# Patient Record
Sex: Female | Born: 1970 | Race: Black or African American | Hispanic: No | Marital: Married | Smoking: Never smoker
Health system: Southern US, Community
[De-identification: ages and names within clinical notes are randomized; demographics above are authoritative.]

## PROBLEM LIST (undated history)

## (undated) DIAGNOSIS — F329 Major depressive disorder, single episode, unspecified: Secondary | ICD-10-CM

## (undated) DIAGNOSIS — D649 Anemia, unspecified: Secondary | ICD-10-CM

## (undated) DIAGNOSIS — F32A Depression, unspecified: Secondary | ICD-10-CM

## (undated) DIAGNOSIS — E785 Hyperlipidemia, unspecified: Secondary | ICD-10-CM

## (undated) DIAGNOSIS — D72829 Elevated white blood cell count, unspecified: Secondary | ICD-10-CM

## (undated) DIAGNOSIS — I10 Essential (primary) hypertension: Secondary | ICD-10-CM

## (undated) DIAGNOSIS — N939 Abnormal uterine and vaginal bleeding, unspecified: Secondary | ICD-10-CM

## (undated) DIAGNOSIS — F419 Anxiety disorder, unspecified: Secondary | ICD-10-CM

## (undated) DIAGNOSIS — I809 Phlebitis and thrombophlebitis of unspecified site: Secondary | ICD-10-CM

## (undated) DIAGNOSIS — D219 Benign neoplasm of connective and other soft tissue, unspecified: Secondary | ICD-10-CM

## (undated) HISTORY — DX: Benign neoplasm of connective and other soft tissue, unspecified: D21.9

## (undated) HISTORY — PX: HERNIA REPAIR: SHX51

## (undated) HISTORY — PX: OOPHORECTOMY: SHX86

## (undated) HISTORY — DX: Abnormal uterine and vaginal bleeding, unspecified: N93.9

## (undated) HISTORY — DX: Anxiety disorder, unspecified: F41.9

## (undated) HISTORY — DX: Phlebitis and thrombophlebitis of unspecified site: I80.9

## (undated) HISTORY — PX: WISDOM TOOTH EXTRACTION: SHX21

## (undated) HISTORY — DX: Anemia, unspecified: D64.9

## (undated) HISTORY — DX: Essential (primary) hypertension: I10

---
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
## 2003-05-01 DIAGNOSIS — I809 Phlebitis and thrombophlebitis of unspecified site: Secondary | ICD-10-CM

## 2003-05-01 HISTORY — DX: Phlebitis and thrombophlebitis of unspecified site: I80.9

## 2016-04-05 ENCOUNTER — Encounter (HOSPITAL_COMMUNITY): Payer: Self-pay

## 2016-04-05 ENCOUNTER — Emergency Department (HOSPITAL_COMMUNITY): Payer: BLUE CROSS/BLUE SHIELD

## 2016-04-05 ENCOUNTER — Emergency Department (HOSPITAL_COMMUNITY)
Admission: EM | Admit: 2016-04-05 | Discharge: 2016-04-05 | Disposition: A | Discharge status: Home / Self Care | Payer: BLUE CROSS/BLUE SHIELD | Attending: Emergency Medicine | Admitting: Emergency Medicine

## 2016-04-05 DIAGNOSIS — R9431 Abnormal electrocardiogram [ECG] [EKG]: Secondary | ICD-10-CM | POA: Diagnosis present

## 2016-04-05 DIAGNOSIS — R002 Palpitations: Secondary | ICD-10-CM | POA: Insufficient documentation

## 2016-04-05 LAB — I-STAT TROPONIN, ED
TROPONIN I, POC: 0 ng/mL (ref 0.00–0.08)
TROPONIN I, POC: 0 ng/mL (ref 0.00–0.08)

## 2016-04-05 LAB — BASIC METABOLIC PANEL
ANION GAP: 7 (ref 5–15)
BUN: 6 mg/dL (ref 6–20)
CALCIUM: 9.3 mg/dL (ref 8.9–10.3)
CO2: 24 mmol/L (ref 22–32)
Chloride: 104 mmol/L (ref 101–111)
Creatinine, Ser: 0.74 mg/dL (ref 0.44–1.00)
GLUCOSE: 125 mg/dL — AB (ref 65–99)
POTASSIUM: 3.8 mmol/L (ref 3.5–5.1)
SODIUM: 135 mmol/L (ref 135–145)

## 2016-04-05 LAB — CBC
HEMATOCRIT: 33.7 % — AB (ref 36.0–46.0)
HEMOGLOBIN: 11.5 g/dL — AB (ref 12.0–15.0)
MCH: 30.4 pg (ref 26.0–34.0)
MCHC: 34.1 g/dL (ref 30.0–36.0)
MCV: 89.2 fL (ref 78.0–100.0)
Platelets: 352 10*3/uL (ref 150–400)
RBC: 3.78 MIL/uL — AB (ref 3.87–5.11)
RDW: 13.6 % (ref 11.5–15.5)
WBC: 11.1 10*3/uL — AB (ref 4.0–10.5)

## 2016-04-05 NOTE — ED Notes (Signed)
Pt ambulatory at discharge. NAD. VSS.

## 2016-04-05 NOTE — ED Provider Notes (Signed)
[facility] DEPT Provider Note   CSN: [id number] Arrival date & time: 04/05/16  1344     History   Chief Complaint Chief Complaint  Patient presents with  . Abnormal ECG    HPI Melanie Hopkins is a 45 y.o. female.  HPI   Patient presents with palpitations.  Patient states this morning about 6:15 AM she was sitting in bed when she felt epigastric discomfort she describes as a "bubble" followed by heart racing that lasted about 3-5 minutes.  It resolved without any intervention.  She went to sleep and when she woke up again, had a BM around 9 AM and it happened again.  She saw her PMD where they performed and EKG.  She states it was "abnormal and my heart was missing beats".  She has a cardiology follow up appointment scheduled.  She presents to the ED for a second opinion.  She denies any fever, chills, cough, SOB, chest pain, N/V/D, or abdominal pain.  No syncope, diaphoresis, or presyncope.  Reports normal BMs, last today, no melena or hematochezia.  She denies cardiac history, family history of early CAD, tobacco use, or DM.  No hx of PE/DVT. She states she did start taking "Adrenal Support" vitamin a couple of weeks ago and wonders if that could be what's making her feel more nervous.  She states this did not feel like previous panic attacks.  She currently denies any symptoms, and states she has not experienced any symptoms since being in the ED.  History reviewed. No pertinent past medical history.  There are no active problems to display for this patient.   History reviewed. No pertinent surgical history.  OB History    No data available       Home Medications    Prior to Admission medications   Medication Sig Start Date End Date Taking? Authorizing Provider  ferrous sulfate (SLOW FE) 160 (50 Fe) MG TBCR SR tablet Take 160 mg by mouth daily.   Yes Historical Provider, MD  Multiple Vitamin (MULTIVITAMIN WITH MINERALS) TABS tablet Take 1 tablet by mouth daily.   Yes  Historical Provider, MD  NON FORMULARY Take 2 tablets by mouth 3 (three) times daily. Liver Cleanse Formula from [employer]   Yes Historical Provider, MD  NON FORMULARY Take 1 capsule by mouth 2 (two) times daily. Adrenal Support made by [employer]   Yes Historical Provider, MD  simethicone (MYLICON) 80 MG chewable tablet Chew 80 mg by mouth every 6 (six) hours as needed for flatulence.   Yes Historical Provider, MD    Family History History reviewed. No pertinent family history.  Social History Social History  Substance Use Topics  . Smoking status: Never Smoker  . Smokeless tobacco: Never Used  . Alcohol use No     Allergies   Patient has no known allergies.   Review of Systems Review of Systems All other systems negative unless otherwise stated in HPI   Physical Exam Updated Vital Signs BP 129/83   Pulse 111   Temp 98.1 F (36.7 C) (Oral)   Resp 22   Ht 5' (1.524 m)   Wt 93.4 kg   SpO2 100%   BMI 40.23 kg/m   Physical Exam  Constitutional: She is oriented to person, place, and time. She appears well-developed and well-nourished.  Non-toxic appearance. She does not have a sickly appearance. She does not appear ill.  HENT:  Head: Normocephalic and atraumatic.  Mouth/Throat: Oropharynx is clear and moist.  Eyes:  Conjunctivae are normal.  Neck: Normal range of motion. Neck supple.  Cardiovascular: Normal rate and regular rhythm.   Pulmonary/Chest: Effort normal and breath sounds normal. No accessory muscle usage or stridor. No respiratory distress. She has no wheezes. She has no rhonchi. She has no rales.  Abdominal: Soft. Bowel sounds are normal. She exhibits no distension. There is no tenderness.  Musculoskeletal: Normal range of motion.  Lymphadenopathy:    She has no cervical adenopathy.  Neurological: She is alert and oriented to person, place, and time.  Speech clear without dysarthria.  Skin: Skin is warm and dry.  Psychiatric: She has a  normal mood and affect. Her behavior is normal.     ED Treatments / Results  Labs (all labs ordered are listed, but only abnormal results are displayed) Labs Reviewed  BASIC METABOLIC PANEL - Abnormal; Notable for the following:       Result Value   Glucose, Bld 125 (*)    All other components within normal limits  CBC - Abnormal; Notable for the following:    WBC 11.1 (*)    RBC 3.78 (*)    Hemoglobin 11.5 (*)    HCT 33.7 (*)    All other components within normal limits  I-STAT TROPOININ, ED  I-STAT TROPOININ, ED    EKG  EKG Interpretation  Date/Time:  Thursday April 05 2016 13:54:57 EST Ventricular Rate:  117 PR Interval:  192 QRS Duration: 72 QT Interval:  320 QTC Calculation: 446 R Axis:   39 Text Interpretation:  Sinus tachycardia Possible Anterior infarct , age undetermined Abnormal ECG No acute changes No old tracing to compare Confirmed by Kathrynn Humble, MD, Thelma Comp [phone]) on 04/05/2016 2:58:07 PM       Radiology Dg Chest 2 View  Result Date: 04/05/2016 CLINICAL DATA:  Sudden onset of weakness.  Chest pain. EXAM: CHEST  2 VIEW COMPARISON:  None. FINDINGS: Normal heart size and mediastinal contours. No acute infiltrate or edema. No effusion or pneumothorax. No acute osseous findings. IMPRESSION: Negative chest. Electronically Signed   By: Monte Fantasia M.D.   On: 04/05/2016 14:31    Procedures Procedures (including critical care time)  Medications Ordered in ED Medications - No data to display   Initial Impression / Assessment and Plan / ED Course  I have reviewed the triage vital signs and the nursing notes.  Pertinent labs & imaging results that were available during my care of the patient were reviewed by me and considered in my medical decision making (see chart for details).  Clinical Course    Patient presents with palpitations.  She is asymptomatic in ED.  Vitals have remained stable.  No red flags (cardiac disease, EKG abnormalities,  syncope/pre-syncope, exertional, s/sx HF).  Low suspicion ACS, HEAR score 2.  Low risk PE with Well's criteria.  Low clinical suspicion for dissection.  Possible GERD.  Labs, CXR, and EKG without acute abnormalities or arrhythmia.  Plan to delta troponin. Patient continues to be asymptomatic in ED.  Evaluation does not show pathology requiring ongoing emergent intervention or admission. Pt is hemodynamically stable and mentating appropriately. Discussed findings/results and plan with patient/guardian, who agrees with plan. All questions answered. Return precautions discussed and outpatient follow up given.   Case has been discussed with Dr. Oleta Mouse who agrees with the above plan for discharge.    Final Clinical Impressions(s) / ED Diagnoses   Final diagnoses:  Palpitations    New Prescriptions New Prescriptions   No medications on file  Gloriann Loan, PA-C 04/05/16 [place] Liu, MD 04/05/16 [phone]

## 2016-04-05 NOTE — ED Triage Notes (Signed)
Pt reports she had a sudden onset of weakness with some abd pain and mild chest pain this morning. Denies currently. Had EKG done at PCP office and was told to sent here due to it being abnormal.

## 2016-04-05 NOTE — Discharge Instructions (Signed)
Stop taking the Adrenal support and monitor your symptoms.  Go to your scheduled cardiology appointment.  Return to the ED for chest pain, shortness of breath, passing out, or any new or concerning symptoms.

## 2016-11-21 ENCOUNTER — Encounter: Payer: Self-pay | Admitting: Obstetrics and Gynecology

## 2016-11-21 ENCOUNTER — Ambulatory Visit (INDEPENDENT_AMBULATORY_CARE_PROVIDER_SITE_OTHER): Payer: BLUE CROSS/BLUE SHIELD | Admitting: Obstetrics and Gynecology

## 2016-11-21 VITALS — BP 118/76 | HR 84 | Resp 16 | Ht 62.0 in | Wt 190.0 lb

## 2016-11-21 DIAGNOSIS — N926 Irregular menstruation, unspecified: Secondary | ICD-10-CM

## 2016-11-21 DIAGNOSIS — D251 Intramural leiomyoma of uterus: Secondary | ICD-10-CM | POA: Diagnosis not present

## 2016-11-21 DIAGNOSIS — D250 Submucous leiomyoma of uterus: Secondary | ICD-10-CM | POA: Diagnosis not present

## 2016-11-21 DIAGNOSIS — D259 Leiomyoma of uterus, unspecified: Secondary | ICD-10-CM

## 2016-11-21 DIAGNOSIS — D219 Benign neoplasm of connective and other soft tissue, unspecified: Secondary | ICD-10-CM | POA: Insufficient documentation

## 2016-11-21 LAB — POCT URINE PREGNANCY: Preg Test, Ur: NEGATIVE

## 2016-11-21 MED ORDER — NORETHINDRONE ACETATE 5 MG PO TABS: ORAL_TABLET | ORAL | 0 refills | Status: DC

## 2016-11-21 MED ORDER — MEGESTROL ACETATE 40 MG PO TABS: 40.0000 mg | ORAL_TABLET | Freq: Two times a day (BID) | ORAL | 0 refills | Status: DC

## 2016-11-21 NOTE — Patient Instructions (Signed)
Uterine Fibroids Uterine fibroids are tissue masses (tumors) that can develop in the womb (uterus). They are also called leiomyomas. This type of tumor is not cancerous (benign) and does not spread to other parts of the body outside of the pelvic area, which is between the hip bones. Occasionally, fibroids may develop in the fallopian tubes, in the cervix, or on the support structures (ligaments) that surround the uterus. You can have one or many fibroids. Fibroids can vary in size, weight, and where they grow in the uterus. Some can become quite large. Most fibroids do not require medical treatment. What are the causes? A fibroid can develop when a single uterine cell keeps growing (replicating). Most cells in the human body have a control mechanism that keeps them from replicating without control. What are the signs or symptoms? Symptoms may include:  Heavy bleeding during your period.  Bleeding or spotting between periods.  Pelvic pain and pressure.  Bladder problems, such as needing to urinate more often (urinary frequency) or urgently.  Inability to reproduce offspring (infertility).  Miscarriages.  How is this diagnosed? Uterine fibroids are diagnosed through a physical exam. Your health care provider may feel the lumpy tumors during a pelvic exam. Ultrasonography and an MRI may be done to determine the size, location, and number of fibroids. How is this treated? Treatment may include:  Watchful waiting. This involves getting the fibroid checked by your health care provider to see if it grows or shrinks. Follow your health care provider's recommendations for how often to have this checked.  Hormone medicines. These can be taken by mouth or given through an intrauterine device (IUD).  Surgery. ? Removing the fibroids (myomectomy) or the uterus (hysterectomy). ? Removing blood supply to the fibroids (uterine artery embolization).  If fibroids interfere with your fertility and you  want to become pregnant, your health care provider may recommend having the fibroids removed. Follow these instructions at home:  Keep all follow-up visits as directed by your health care provider. This is important.  Take over-the-counter and prescription medicines only as told by your health care provider. ? If you were prescribed a hormone treatment, take the hormone medicines exactly as directed.  Ask your health care provider about taking iron pills and increasing the amount of dark green, leafy vegetables in your diet. These actions can help to boost your blood iron levels, which may be affected by heavy menstrual bleeding.  Pay close attention to your period and tell your health care provider about any changes, such as: ? Increased blood flow that requires you to use more pads or tampons than usual per month. ? A change in the number of days that your period lasts per month. ? A change in symptoms that are associated with your period, such as abdominal cramping or back pain. Contact a health care provider if:  You have pelvic pain, back pain, or abdominal cramps that cannot be controlled with medicines.  You have an increase in bleeding between and during periods.  You soak tampons or pads in a half hour or less.  You feel lightheaded, extra tired, or weak. Get help right away if:  You faint.  You have a sudden increase in pelvic pain. This information is not intended to replace advice given to you by your health care provider. Make sure you discuss any questions you have with your health care provider. Document Released: 04/13/2000 Document Revised: 12/15/2015 Document Reviewed: 10/13/2013 Elsevier Interactive Patient Education  2018 Elsevier Inc.    Total Laparoscopic Hysterectomy A total laparoscopic hysterectomy is a minimally invasive surgery to remove your uterus and cervix. This surgery is performed by making several small cuts (incisions) in your abdomen. It can also  be done with a thin, lighted tube (laparoscope) inserted into two small incisions in your lower abdomen. Your fallopian tubes and ovaries can be removed (bilateral salpingo-oophorectomy) during this surgery as well.Benefits of minimally invasive surgery include:  Less pain.  Less risk of blood loss.  Less risk of infection.  Quicker return to normal activities.  Tell a health care provider about:  Any allergies you have.  All medicines you are taking, including vitamins, herbs, eye drops, creams, and over-the-counter medicines.  Any problems you or family members have had with anesthetic medicines.  Any blood disorders you have.  Any surgeries you have had.  Any medical conditions you have. What are the risks? Generally, this is a safe procedure. However, as with any procedure, complications can occur. Possible complications include:  Bleeding.  Blood clots in the legs or lung.  Infection.  Injury to surrounding organs.  Problems with anesthesia.  Early menopause symptoms (hot flashes, night sweats, insomnia).  Risk of conversion to an open abdominal incision.  What happens before the procedure?  Ask your health care provider about changing or stopping your regular medicines.  Do not take aspirin or blood thinners (anticoagulants) for 1 week before the surgery or as told by your health care provider.  Do not eat or drink anything for 8 hours before the surgery or as told by your health care provider.  Quit smoking if you smoke.  Arrange for a ride home after surgery and for someone to help you at home during recovery. What happens during the procedure?  You will be given antibiotic medicine.  An IV tube will be placed in your arm. You will be given medicine to make you sleep (general anesthetic).  A gas (carbon dioxide) will be used to inflate your abdomen. This will allow your surgeon to look inside your abdomen, perform your surgery, and treat any other  problems found if necessary.  Three or four small incisions (often less than 1/2 inch) will be made in your abdomen. One of these incisions will be made in the area of your belly button (navel). The laparoscope will be inserted into the incision. Your surgeon will look through the laparoscope while doing your procedure.  Other surgical instruments will be inserted through the other incisions.  Your uterus may be removed through your vagina or cut into small pieces and removed through the small incisions.  Your incisions will be closed. What happens after the procedure?  The gas will be released from inside your abdomen.  You will be taken to the recovery area where a nurse will watch and check your progress. Once you are awake, stable, and taking fluids well, without other problems, you will return to your room or be allowed to go home.  There is usually minimal discomfort following the surgery because the incisions are so small.  You will be given pain medicine while you are in the hospital and for when you go home. This information is not intended to replace advice given to you by your health care provider. Make sure you discuss any questions you have with your health care provider. Document Released: 02/11/2007 Document Revised: 09/22/2015 Document Reviewed: 11/04/2012 Elsevier Interactive Patient Education  2017 Elsevier Inc.  

## 2016-11-21 NOTE — Progress Notes (Signed)
GYNECOLOGY  VISIT   HPI: 46 y.o.   Married  [place] American  female   [phone] with Patient's last menstrual period was 11/06/2016.   here for  Bleeding fibroids. Per patient has been bleeding on and off since May 2018. Patient is on OCP. Patient has been a patient at [facility]; referred from a friend.  Bleeding since July 10.   Could use one panty liner for 6 - 7 hours.  Feels pressure in her lower abdomen which makes her sit differently.  Notes cramping and pain in her anterior iliac crests. Ibuprofen 80 mg twice a day not helpful. Notes increased blood when she urinates.  States it is vaginal.  Some urgency to void.   She feels like her symptoms are cyclic and occurring yearly.   States anemia in May 2018 - 8.6.  Placed on iron and taking consistently.  Increasing iron in her diet.   Menses before July were irregular. May, June, and July - bleeding and spotting and cycles longer than usual.   Status post EMB one year ago at [facility] showing chronic endometritis.  Took Doxycycline for 10 days and this improved her symptoms of bleeding and pain.   Pelvic US 11/13/16 Uterus with likely submucosal fibroid 1.8 x 1.5 x 1.8 cm.  Intramural fibroid measuring 4.5 cm anterior fundal region. EMS 13 mm.  Ovaries not visualized.  Patient was offered hysterectomy, myomectomy, Depo Lupron, and birth control.  Taking an herbal supplement.  Just started her OCPs a few days ago. Is not feeling comfortable with this plan and really expected her bleeding to stop already.  Just wants to bleeding to stop.  Leaves in 2 days for a cruise to [place]. Will be gone until August 4th.   Some night sweats.   She declines future childbearing.   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  Tx with ASA. Maternal GM with DVT at age 23 yo.  GYNECOLOGIC HISTORY: Patient's last menstrual period was 11/06/2016. Contraception:  OCP, Vasectomy.  Menopausal hormone therapy:   n/a Last mammogram:  Scheduled for 12/07/16 at [facility] Last pap smear:   May or June 2018 - normal per patient        OB History    Gravida Para Term Preterm AB Living   3 2 2  0 1 2   SAB TAB Ectopic Multiple Live Births   0 0 0 0 0         There are no active problems to display for this patient.   Past Medical History:  Diagnosis Date  . Abnormal uterine bleeding   . Anemia   . Anxiety   . Fibroid     Past Surgical History:  Procedure Laterality Date  . CESAREAN SECTION    . HERNIA REPAIR    . OOPHORECTOMY Left     Current Outpatient Prescriptions  Medication Sig Dispense Refill  . atorvastatin (LIPITOR) 10 MG tablet Take 1 tablet by mouth daily.    Marland Kitchen BALZIVA 0.4-35 MG-MCG tablet Take 1 tablet by mouth daily.    Marland Kitchen escitalopram (LEXAPRO) 10 MG tablet Take 1 tablet by mouth daily.    . ferrous sulfate (SLOW FE) 160 (50 Fe) MG TBCR SR tablet Take 160 mg by mouth daily.    . Multiple Vitamin (MULTIVITAMIN WITH MINERALS) TABS tablet Take 1 tablet by mouth daily.    . megestrol (MEGACE) 40 MG tablet Take 1 tablet (40 mg total) by  mouth 2 (two) times daily. 60 tablet 0   No current facility-administered medications for this visit.      ALLERGIES: Patient has no known allergies.  Family History  Problem Relation Age of Onset  . Hypertension Mother   . Hyperlipidemia Mother   . Hypertension Father   . Hyperlipidemia Father   . Congestive Heart Failure Father     Social History   Social History  . Marital status: Married    Spouse name: N/A  . Number of children: N/A  . Years of education: N/A   Occupational History  . Not on file.   Social History Main Topics  . Smoking status: Never Smoker  . Smokeless tobacco: Never Used  . Alcohol use No  . Drug use: No  . Sexual activity: Yes    Birth control/ protection: Pill   Other Topics Concern  . Not on file   Social History Narrative  . No narrative on file    ROS:  Pertinent items are  noted in HPI.  PHYSICAL EXAMINATION:    BP 118/76 (BP Location: Right Arm, Patient Position: Sitting, Cuff Size: Normal)   Pulse 84   Resp 16   Ht 5\' 2"  (1.575 m)   Wt 190 lb (86.2 kg)   LMP 11/06/2016   BMI 34.75 kg/m     General appearance: alert, cooperative and appears stated age Head: Normocephalic, without obvious abnormality, atraumatic Neck: no adenopathy, supple, symmetrical, trachea midline and thyroid normal to inspection and palpation Lungs: clear to auscultation bilaterally Heart: regular rate and rhythm Abdomen: Scar above the umbilicus.  Pfannenstiel incision, soft, non-tender, no masses,  no organomegaly Extremities: extremities normal, atraumatic, no cyanosis or edema Skin: Skin color, texture, turgor normal. No rashes or lesions.  No abnormal inguinal nodes palpated Neurologic: Grossly normal  Pelvic: External genitalia:  no lesions              Urethra:  normal appearing urethra with no masses, tenderness or lesions              Bartholins and Skenes: normal                 Vagina: normal appearing vagina with normal color and discharge, no lesions              Cervix: no lesions.  Large amount of dark blood in the vagina.                  Bimanual Exam:  Uterus: 10 -11 week size, nontender.               Adnexa: no mass, fullness, tenderness                Chaperone was present for exam.  ASSESSMENT   Uterine fibroids.  Submucous fibroid. Hx Cesarean Section.  Hx hernia repair.  States no mesh used. Hx oophorectomy.  Permanent contraception with husband's vasectomy.  Hx of superficial thrombosis of a vein.   PLAN  Discussed fibroids and tx options.  Hysterectomy, myomectomy, Depo Lupron, Depo Provera, oral contraceptives. She is interested in potential laparoscopic hysterectomy with bilateral salpingectomy. May need Depo Lupron.  CBC. Megace 40 mg po bid.  UPT - negative. Told her she may need to cancel her trip due to her bleeding.  We will  report her hemoglobin level to her tomorrow.  I broached the possibility of transfusion with the patient depending on her level of anemia. Follow up plan to  follow.    An After Visit Summary was printed and given to the patient.  ___45___ minutes face to face time of which over 50% was spent in counseling.

## 2016-11-22 ENCOUNTER — Ambulatory Visit (INDEPENDENT_AMBULATORY_CARE_PROVIDER_SITE_OTHER): Payer: BLUE CROSS/BLUE SHIELD | Admitting: Obstetrics and Gynecology

## 2016-11-22 ENCOUNTER — Encounter: Payer: Self-pay | Admitting: Obstetrics and Gynecology

## 2016-11-22 ENCOUNTER — Telehealth: Payer: Self-pay | Admitting: *Deleted

## 2016-11-22 VITALS — BP 130/76 | HR 64 | Ht 62.0 in | Wt 190.0 lb

## 2016-11-22 DIAGNOSIS — N711 Chronic inflammatory disease of uterus: Secondary | ICD-10-CM

## 2016-11-22 DIAGNOSIS — N939 Abnormal uterine and vaginal bleeding, unspecified: Secondary | ICD-10-CM

## 2016-11-22 LAB — CBC
HEMOGLOBIN: 11.5 g/dL (ref 11.1–15.9)
Hematocrit: 35.3 % (ref 34.0–46.6)
MCH: 31.7 pg (ref 26.6–33.0)
MCHC: 32.6 g/dL (ref 31.5–35.7)
MCV: 97 fL (ref 79–97)
PLATELETS: 421 10*3/uL — AB (ref 150–379)
RBC: 3.63 x10E6/uL — ABNORMAL LOW (ref 3.77–5.28)
RDW: 13.9 % (ref 12.3–15.4)
WBC: 11.9 10*3/uL — ABNORMAL HIGH (ref 3.4–10.8)

## 2016-11-22 MED ORDER — AZITHROMYCIN 250 MG PO TABS: ORAL_TABLET | ORAL | 0 refills | Status: DC

## 2016-11-22 NOTE — Telephone Encounter (Signed)
Call to patient X 2 attempts. No answer and no voice mail. Unable to leave message.

## 2016-11-22 NOTE — Progress Notes (Signed)
GYNECOLOGY  VISIT   HPI: 46 y.o.   Married  [place] American  female   [phone] with Patient's last menstrual period was 11/06/2016 (exact date).   here for   EMB.  Elevated WBC yesterday.  Hgb normal.   Today is having less bleeding.   Started Megace yesterday evening for bleeding fibroids.  She has a hx of chronic endometritis last year.  Status post left oophorectomy at Cesarean section.  Benign per patient.   GYNECOLOGIC HISTORY: Patient's last menstrual period was 11/06/2016 (exact date). Contraception:  Vasectomy Menopausal hormone therapy:  N/A Last mammogram:  Scheduled for 12/07/16 at [facility] Last pap smear:   May or June 2018 - normal per patient        OB History    Gravida Para Term Preterm AB Living   3 2 2  0 1 2   SAB TAB Ectopic Multiple Live Births   0 0 0 0 2         Patient Active Problem List   Diagnosis Date Noted  . Fibroids 11/21/2016    Past Medical History:  Diagnosis Date  . Abnormal uterine bleeding   . Anemia   . Anxiety   . Fibroid     Past Surgical History:  Procedure Laterality Date  . CESAREAN SECTION    . HERNIA REPAIR    . OOPHORECTOMY Left     Current Outpatient Prescriptions  Medication Sig Dispense Refill  . atorvastatin (LIPITOR) 10 MG tablet Take 1 tablet by mouth daily.    Marland Kitchen BALZIVA 0.4-35 MG-MCG tablet Take 1 tablet by mouth daily.    Marland Kitchen escitalopram (LEXAPRO) 10 MG tablet Take 1 tablet by mouth daily.    . ferrous sulfate (SLOW FE) 160 (50 Fe) MG TBCR SR tablet Take 160 mg by mouth daily.    . megestrol (MEGACE) 40 MG tablet Take 1 tablet (40 mg total) by mouth 2 (two) times daily. 60 tablet 0  . Multiple Vitamin (MULTIVITAMIN WITH MINERALS) TABS tablet Take 1 tablet by mouth daily.     No current facility-administered medications for this visit.      ALLERGIES: Patient has no known allergies.  Family History  Problem Relation Age of Onset  . Hypertension Mother   . Hyperlipidemia Mother   .  Hypertension Father   . Hyperlipidemia Father   . Congestive Heart Failure Father     Social History   Social History  . Marital status: Married    Spouse name: N/A  . Number of children: N/A  . Years of education: N/A   Occupational History  . Not on file.   Social History Main Topics  . Smoking status: Never Smoker  . Smokeless tobacco: Never Used  . Alcohol use No  . Drug use: No  . Sexual activity: Yes    Birth control/ protection: Pill   Other Topics Concern  . Not on file   Social History Narrative  . No narrative on file    ROS:  Pertinent items are noted in HPI.  PHYSICAL EXAMINATION:    BP 130/76 (BP Location: Right Arm, Patient Position: Sitting, Cuff Size: Large)   Pulse 64   Ht 5\' 2"  (1.575 m)   Wt 190 lb (86.2 kg)   LMP 11/06/2016 (Exact Date)   BMI 34.75 kg/m     General appearance: alert, cooperative and appears stated age   EMB done under sterile conditions with hibiclens.  Tenaculum to ant. Cervical lip.  Pipelle  passed x 2 to 7 cm.  No complications.  Minimal EBL.  I noted less bleeding today with exam.    Chaperone was present for exam.  ASSESSMENT  Abnormal uterine bleeding.  Hx chronic endometritis.  Fibroids. Left oophorectomy.   PLAN  GC/CT.  Follow up EMB.  Instructions/precautions given. Azithromycin 500 mg orally and then 250 mg po q d x 4 days. (going on a cruise so I do not want to use doxycycline.) Continue Megace.  (Does not need to be taking Aygestin.) Follow up in about 2 weeks for sonohysterogram.  We are working toward a laparoscopic hysterectomy.   An After Visit Summary was printed and given to the patient.  __15_____ minutes face to face time of which over 50% was spent in counseling.

## 2016-11-22 NOTE — Telephone Encounter (Deleted)
-----   Message from Nunzio Cobbs, MD sent at 11/22/2016  8:54 AM EDT ----- Please report normal hemoglobin to patient.  She has an elevated white blood cell count.  She has a hx of chronic endometritis.  I would like for her to return to do an endometrial biopsy and cervical cultures and then have an ultrasound in 2 weeks.  She is leaving town this weekend, and I would like to see her back if possible before she goes.  I will likely treat her empirically after I do the biopsy.  She is to take the Megace I prescribed.  Cc- Lamont Snowball

## 2016-11-22 NOTE — Telephone Encounter (Signed)
-----   Message from Nunzio Cobbs, MD sent at 11/22/2016  8:54 AM EDT ----- Please report normal hemoglobin to patient.  She has an elevated white blood cell count.  She has a hx of chronic endometritis.  I would like for her to return to do an endometrial biopsy and cervical cultures and then have an ultrasound in 2 weeks.  She is leaving town this weekend, and I would like to see her back if possible before she goes.  I will likely treat her empirically after I do the biopsy.  She is to take the Megace I prescribed.  Cc- Lamont Snowball

## 2016-11-22 NOTE — Telephone Encounter (Signed)
Return call from patient. Advised of results and recommendations as directed by Dr Quincy Simmonds. Patient scheduled for appointment today at 115 with Dr Quincy Simmonds. Advised to take Motrin 800 mg one hour prior with food.   Encounter closed.

## 2016-11-22 NOTE — Patient Instructions (Signed)

## 2016-11-23 LAB — GC/CHLAMYDIA PROBE AMP
CHLAMYDIA, DNA PROBE: NEGATIVE
NEISSERIA GONORRHOEAE BY PCR: NEGATIVE

## 2016-11-26 ENCOUNTER — Telehealth: Payer: Self-pay | Admitting: *Deleted

## 2016-11-26 NOTE — Telephone Encounter (Signed)
Call to patient to review benefit and discuss scheduling for recommended Sonohystogram. Automated message stated "unable to complete phone call at this time". Unable to leave voicemail.   Per most recent office note, Sonohystogram should be scheduled approximately 2 weeks from last appointment putting it on or around 12-06-16.   Routing to [employer] for [employer] as she was able to leave a voicemail for patient earlier today.

## 2016-11-26 NOTE — Telephone Encounter (Signed)
Notes recorded by Burnice Logan, RN on 11/26/2016 at 12:18 PM EDT Left message to call Sharee Pimple at [phone].  ------  Notes recorded by Nunzio Cobbs, MD on 11/25/2016 at 11:12 AM EDT Please inform patient of results.  She had a negative GC/CT.  She had a biopsy of the endometrium showing degenerating endometrium.  There was no sign of infection or malignancy.

## 2016-11-27 NOTE — Telephone Encounter (Signed)
Placed call to patient to review benefit information for recommended ultrasound. Initially received an automated message stating "unable to completed call at this time message [id number]", but staying on the line provided an option to leave a message. I was able to complete a voicemail message and "send" the message. Upon return call will need to convey benefits and schedule with Dr Quincy Simmonds on or around 12/06/16.

## 2016-11-28 NOTE — Telephone Encounter (Signed)
Placed call to patient to review benefits from recommended ultrasound. Left voicemail message requesting a return call.

## 2016-11-29 NOTE — Telephone Encounter (Signed)
Spoke with patient regarding benefit for sonohysterogram. Patient understood and agreeable. Patient ready to schedule. Patient scheduled 12/06/16 with Dr Quincy Simmonds. Patient aware of date, arrival time and cancellation policy. Patient states she is feeling much better and was able to rest on her recent vacation. Call transferred to Glorianne Manchester, RN to convey lab results.    Routing to Dr Quincy Simmonds  cc: Glorianne Manchester, RN

## 2016-11-29 NOTE — Telephone Encounter (Signed)
Spoke with patient, advised of results as seen below per Dr. Quincy Simmonds. Patient asking what next step is? Advised to proceed with [facility] as previously recommended by Dr. Quincy Simmonds. Patient has additional questions about  degenerating endometrium r/t her symptoms, recommended further discussion with Dr. Quincy Simmonds at [facility] appointment. Patient verbalizes understanding and is agreeable.   Routing to provider for final review. Patient is agreeable to disposition. Will close encounter.

## 2016-11-29 NOTE — Telephone Encounter (Signed)
Thank you for the update.  [place]

## 2016-12-06 ENCOUNTER — Ambulatory Visit (INDEPENDENT_AMBULATORY_CARE_PROVIDER_SITE_OTHER): Payer: BLUE CROSS/BLUE SHIELD

## 2016-12-06 ENCOUNTER — Other Ambulatory Visit: Payer: Self-pay | Admitting: Obstetrics and Gynecology

## 2016-12-06 ENCOUNTER — Encounter: Payer: Self-pay | Admitting: Obstetrics and Gynecology

## 2016-12-06 ENCOUNTER — Ambulatory Visit (INDEPENDENT_AMBULATORY_CARE_PROVIDER_SITE_OTHER): Payer: BLUE CROSS/BLUE SHIELD | Admitting: Obstetrics and Gynecology

## 2016-12-06 VITALS — BP 130/86 | HR 84 | Ht 62.0 in | Wt 190.0 lb

## 2016-12-06 DIAGNOSIS — D259 Leiomyoma of uterus, unspecified: Secondary | ICD-10-CM

## 2016-12-06 DIAGNOSIS — N939 Abnormal uterine and vaginal bleeding, unspecified: Secondary | ICD-10-CM

## 2016-12-06 MED ORDER — MEDROXYPROGESTERONE ACETATE 150 MG/ML IM SUSP
150.0000 mg | Freq: Once | INTRAMUSCULAR | Status: AC
  Administered 2016-12-06: 150 mg via INTRAMUSCULAR

## 2016-12-06 NOTE — Patient Instructions (Signed)

## 2016-12-06 NOTE — Progress Notes (Signed)
Encounter reviewed by Dr. Cyndee Giammarco Amundson C. Silva.  

## 2016-12-06 NOTE — Progress Notes (Signed)
GYNECOLOGY  VISIT   HPI: 46 y.o.   Married  [place] American  female   [phone] with Patient's last menstrual period was 11/06/2016 (exact date).   here for sonohysterogram for abnormal uterine bleeding and lower abdominal pressure.  She presented with abnormal uterine bleeding. Status post EMB which was benign.  Patient was empirically treated for endometritis based on elevated WBC at the time of her initial presentation.  (She has a hx of chronic endometritis one year ago dx through EMB at [facility] She was also started on Megace at her las visit and this has been successful at controlling her bleeding.  Not a smoker.   No HTN, liver disease, or migraine with aura. Some hx of migraine HA.   No intercourse for 4 months.   Hx superficial thrombophlebitis treated with ASA.  No hx DVT of patient.  MGM did have DVT/PE with decreased mobility.  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 Will also travel outside the country in September.   GYNECOLOGIC HISTORY: Patient's last menstrual period was 11/06/2016 (exact date). Contraception:  Vasectomy Menopausal hormone therapy:  n/a Last mammogram: Scheduled 12-07-16 at [place] pap smear:  May or June 2018 - normal per patient         OB History    Gravida Para Term Preterm AB Living   3 2 2  0 1 2   SAB TAB Ectopic Multiple Live Births   0 0 0 0 2         Patient Active Problem List   Diagnosis Date Noted  . Fibroids 11/21/2016    Past Medical History:  Diagnosis Date  . Abnormal uterine bleeding   . Anemia   . Anxiety   . Fibroid     Past Surgical History:  Procedure Laterality Date  . CESAREAN SECTION    . HERNIA REPAIR    . OOPHORECTOMY Left     Current Outpatient Prescriptions  Medication Sig Dispense Refill  . atorvastatin (LIPITOR) 10 MG tablet Take 1 tablet by mouth daily.    Marland Kitchen escitalopram (LEXAPRO) 10 MG tablet Take 1 tablet by mouth daily.    . ferrous sulfate (SLOW FE) 160 (50 Fe) MG TBCR SR tablet Take  160 mg by mouth daily.    . megestrol (MEGACE) 40 MG tablet Take 1 tablet (40 mg total) by mouth 2 (two) times daily. 60 tablet 0   No current facility-administered medications for this visit.      ALLERGIES: Patient has no known allergies.  Family History  Problem Relation Age of Onset  . Hypertension Mother   . Hyperlipidemia Mother   . Hypertension Father   . Hyperlipidemia Father   . Congestive Heart Failure Father     Social History   Social History  . Marital status: Married    Spouse name: N/A  . Number of children: N/A  . Years of education: N/A   Occupational History  . Not on file.   Social History Main Topics  . Smoking status: Never Smoker  . Smokeless tobacco: Never Used  . Alcohol use No  . Drug use: No  . Sexual activity: Yes    Partners: Male    Birth control/ protection: Other-see comments     Comment: husband with vasectomy   Other Topics Concern  . Not on file   Social History Narrative  . No narrative on file    ROS:  Pertinent items are noted in HPI.  PHYSICAL EXAMINATION:  BP 130/86 (BP Location: Right Arm, Patient Position: Sitting, Cuff Size: Large)   Pulse 84   Ht 5\' 2"  (1.575 m)   Wt 190 lb (86.2 kg)   LMP 11/06/2016 (Exact Date)   BMI 34.75 kg/m     General appearance: alert, cooperative and appears stated age  Pelvic US Uterus with 4 fibroids - largest 5.7 cm - intramural.  EMS 11.95 mm. Right ovary absent.  Left ovary normal. No free fluid.  Sonohysterogram performed after consent obtained.  Sterile prep with Hibiclens.  Tenaculum to ant cervical lip.  Cannula placed and sterile saline injected.  Scalloped outline of endometrium throughout cavity.  No complications.   Chaperone was present for exam.  ASSESSMENT  Fibroids.  Hx superficial thrombophlebitis.  Currently on Megace.  PLAN  Discussion of fibroids.  Options for tx reviewed - BC pills with or without estrogen, Nexplanon, Depo Provera, Mirena,  myomectomy, hysterectomy.  Depo Provera 150 mg IM x 1.  Follow up in 3 months.   An After Visit Summary was printed and given to the patient.  __25____ minutes face to face time of which over 50% was spent in counseling.

## 2016-12-18 ENCOUNTER — Other Ambulatory Visit: Payer: Self-pay | Admitting: Obstetrics and Gynecology

## 2016-12-19 ENCOUNTER — Other Ambulatory Visit: Payer: Self-pay | Admitting: Obstetrics and Gynecology

## 2016-12-19 NOTE — Telephone Encounter (Signed)
Medication refill request: aygestin and megace  Last AEX:   Next AEX: recheck 03/08/17  Last MMG (if hormonal medication request): none Refill authorized: both 11/21/16 #60/0R.   Patient on Depo provera

## 2017-01-01 ENCOUNTER — Telehealth: Payer: Self-pay | Admitting: Obstetrics and Gynecology

## 2017-01-01 NOTE — Telephone Encounter (Signed)
Patient is questioning when she needs to return for her next Depo Provera?Patient feels like Dr.Silva told her to return in 30 days for her next Depo.

## 2017-01-01 NOTE — Telephone Encounter (Signed)
Spoke with patient, advised per review of OV notes dated 12/06/16 depo-provera injection given, f/u in 3 months. Next depo due 02/21/17-03/07/17. Patient previously scheduled for nurse visit on 10/30 and f/u with Dr.Silva on 03/08/17.  Patient states she was unsure of how bleeding may be with depo-provera, planned to discuss at f/u. Advised may experience irregular bleeding during first 3 months as cycles adjust, monitor & calendar menses, call if bleeding becomes heavier than normal cycle.   Advised patient Dr. Quincy Simmonds is out of the office, covering provider will review, will return call with any additional recommendations. Patient verbalizes understanding and is agreeable.  Routing to covering provider for final review. Patient is agreeable to disposition. Will close encounter.  Cc: Dr. Quincy Simmonds

## 2017-02-26 ENCOUNTER — Ambulatory Visit (INDEPENDENT_AMBULATORY_CARE_PROVIDER_SITE_OTHER): Payer: BLUE CROSS/BLUE SHIELD | Admitting: *Deleted

## 2017-02-26 VITALS — BP 122/80 | HR 84 | Resp 14 | Ht 62.0 in | Wt 207.0 lb

## 2017-02-26 DIAGNOSIS — D251 Intramural leiomyoma of uterus: Secondary | ICD-10-CM | POA: Diagnosis not present

## 2017-02-26 DIAGNOSIS — D250 Submucous leiomyoma of uterus: Secondary | ICD-10-CM

## 2017-02-26 MED ORDER — MEDROXYPROGESTERONE ACETATE 150 MG/ML IM SUSP
150.0000 mg | Freq: Once | INTRAMUSCULAR | Status: AC
  Administered 2017-02-26: 150 mg via INTRAMUSCULAR

## 2017-02-26 NOTE — Progress Notes (Signed)
Patient is here for Depo Provera Injection Patient is within Depo Provera Calender Limits: yes last depo 12/06/16 Next Depo Due between: 1/15-1/29 Last AEX: 08/2016 AEX Scheduled: OV 03/08/17  Patient is aware when next depo is due  Pt tolerated Injection well. Left gluteus today  Routed to provider for review, encounter closed.

## 2017-03-08 ENCOUNTER — Ambulatory Visit: Payer: BLUE CROSS/BLUE SHIELD | Admitting: Obstetrics and Gynecology

## 2017-03-08 ENCOUNTER — Encounter: Payer: Self-pay | Admitting: Obstetrics and Gynecology

## 2017-03-08 ENCOUNTER — Other Ambulatory Visit: Payer: Self-pay

## 2017-03-08 VITALS — BP 120/80 | HR 80 | Ht 62.0 in | Wt 204.0 lb

## 2017-03-08 DIAGNOSIS — D219 Benign neoplasm of connective and other soft tissue, unspecified: Secondary | ICD-10-CM

## 2017-03-08 LAB — CBC WITH DIFFERENTIAL/PLATELET
BASOS: 0 %
Basophils Absolute: 0 10*3/uL (ref 0.0–0.2)
EOS (ABSOLUTE): 0.2 10*3/uL (ref 0.0–0.4)
EOS: 2 %
HEMATOCRIT: 39.3 % (ref 34.0–46.6)
HEMOGLOBIN: 13.1 g/dL (ref 11.1–15.9)
IMMATURE GRANS (ABS): 0 10*3/uL (ref 0.0–0.1)
Immature Granulocytes: 0 %
LYMPHS: 35 %
Lymphocytes Absolute: 3 10*3/uL (ref 0.7–3.1)
MCH: 30.8 pg (ref 26.6–33.0)
MCHC: 33.3 g/dL (ref 31.5–35.7)
MCV: 93 fL (ref 79–97)
MONOCYTES: 8 %
Monocytes Absolute: 0.7 10*3/uL (ref 0.1–0.9)
NEUTROS ABS: 4.7 10*3/uL (ref 1.4–7.0)
Neutrophils: 55 %
Platelets: 301 10*3/uL (ref 150–379)
RBC: 4.25 x10E6/uL (ref 3.77–5.28)
RDW: 15 % (ref 12.3–15.4)
WBC: 8.7 10*3/uL (ref 3.4–10.8)

## 2017-03-08 NOTE — Progress Notes (Signed)
GYNECOLOGY  VISIT   HPI: 46 y.o.   Married  [place] American  female   [phone] with Patient's last menstrual period was 03/07/2017 (exact date).   here for 3 month follow on fibroids. Patient feeling much better.  Has 4 fibroids ranging from 1.76 to 5.72 cm.  Sonohysterogram showed multiple filling defects which looks like scalloping along of the endometrial cavity.  EMB showed degenerating endometrium.   Received Depo Provera on 12/06/16 and 02/26/17.  Very pleased because she can get in the tub and a hot tub. Able to have sex again without bleeding or pain.   Has spotting only.  Wearing a thin panty liner.  Last month the spotting lasted 8 - 9 days.  This month having spotting with wiping for the last 2 days. The bleeding occurs every 21 days.  Is an event planner and travels a lot. Notes weight gain.   GYNECOLOGIC HISTORY: Patient's last menstrual period was 03/07/2017 (exact date). Contraception:  Vasectomy Menopausal hormone therapy:  n/a Lastmammogram:12-11-16 FattyTissue/Neg/BiRads1:[facility] Last pap smear:  May or June 2018 - normal per patient        OB History    Gravida Para Term Preterm AB Living   3 2 2  0 1 2   SAB TAB Ectopic Multiple Live Births   0 0 0 0 2         Patient Active Problem List   Diagnosis Date Noted  . Fibroids 11/21/2016    Past Medical History:  Diagnosis Date  . Abnormal uterine bleeding   . Anemia   . Anxiety   . Fibroid   . Superficial thrombophlebitis 2005   One episode.  Treated with ASA.    Past Surgical History:  Procedure Laterality Date  . CESAREAN SECTION    . HERNIA REPAIR     supraumbilical herniorrhaphy after birth of second child.  [facility] [place].   No mesh per patient.  . OOPHORECTOMY Left     Current Outpatient Medications  Medication Sig Dispense Refill  . ferrous sulfate (SLOW FE) 160 (50 Fe) MG TBCR SR tablet Take 160 mg by mouth daily.     No current facility-administered medications for this  visit.      ALLERGIES: Patient has no known allergies.  Family History  Problem Relation Age of Onset  . Hypertension Mother   . Hyperlipidemia Mother   . Hypertension Father   . Hyperlipidemia Father   . Congestive Heart Failure Father     Social History   Socioeconomic History  . Marital status: Married    Spouse name: Not on file  . Number of children: Not on file  . Years of education: Not on file  . Highest education level: Not on file  Social Needs  . Financial resource strain: Not on file  . Food insecurity - worry: Not on file  . Food insecurity - inability: Not on file  . Transportation needs - medical: Not on file  . Transportation needs - non-medical: Not on file  Occupational History  . Not on file  Tobacco Use  . Smoking status: Never Smoker  . Smokeless tobacco: Never Used  Substance and Sexual Activity  . Alcohol use: No  . Drug use: No  . Sexual activity: Yes    Partners: Male    Birth control/protection: Other-see comments    Comment: husband with vasectomy  Other Topics Concern  . Not on file  Social History Narrative  . Not on file  ROS:  Pertinent items are noted in HPI.  PHYSICAL EXAMINATION:    BP 120/80 (BP Location: Right Arm, Patient Position: Sitting, Cuff Size: Large)   Pulse 80   Ht 5\' 2"  (1.575 m)   Wt 204 lb (92.5 kg)   LMP 03/07/2017 (Exact Date) Comment: spotting only  BMI 37.31 kg/m     General appearance: alert, cooperative and appears stated age   Pelvic: External genitalia:  no lesions              Urethra:  normal appearing urethra with no masses, tenderness or lesions              Bartholins and Skenes: normal                 Vagina: normal appearing vagina with normal color and discharge, no lesions              Cervix: no lesions.  Small amount of spotting noted.                Bimanual Exam:  Uterus:   12 week size and broad, good mobility, non-tender.              Adnexa: no mass, fullness, tenderness                  Chaperone was present for exam.  ASSESSMENT  Fibroids.  Bleeding controlled on Depo Provera. Hx elevated WBC.  Weight gain.   PLAN  Ok to continue Depo Provera q 3 months until annual exam is due.  We talked about Depo Lupron which I do not recommend at this time.  She will call if she develops bleeding in between cycles.  Check CBC with diff.  We talked about healthy eating and exercise for weight reduction.  She expresses appreciation for her care.  Annual exam in August 2019.   An After Visit Summary was printed and given to the patient.  __25__ minutes face to face time of which over 50% was spent in counseling.

## 2017-05-14 ENCOUNTER — Ambulatory Visit (INDEPENDENT_AMBULATORY_CARE_PROVIDER_SITE_OTHER): Payer: BLUE CROSS/BLUE SHIELD

## 2017-05-14 DIAGNOSIS — Z3042 Encounter for surveillance of injectable contraceptive: Secondary | ICD-10-CM | POA: Diagnosis not present

## 2017-05-14 MED ORDER — MEDROXYPROGESTERONE ACETATE 150 MG/ML IM SUSP
150.0000 mg | Freq: Once | INTRAMUSCULAR | Status: AC
  Administered 2017-05-14: 150 mg via INTRAMUSCULAR

## 2017-05-14 NOTE — Progress Notes (Signed)
Patient is here for Depo Provera Injection Patient is within Depo Provera Calender Limits yes, 1/15-1/29 Next Depo Due between: 4/2-4/16 Last OV: 11/21/16 BS AEX Scheduled: 01/13/18  Patient is aware when next depo is due  Pt tolerated Injection well in [place].  Routed to provider for review, encounter closed.

## 2017-08-01 ENCOUNTER — Ambulatory Visit (INDEPENDENT_AMBULATORY_CARE_PROVIDER_SITE_OTHER): Payer: BLUE CROSS/BLUE SHIELD | Admitting: *Deleted

## 2017-08-01 ENCOUNTER — Other Ambulatory Visit: Payer: Self-pay

## 2017-08-01 VITALS — BP 142/82 | HR 92 | Resp 14 | Ht 61.0 in | Wt 211.0 lb

## 2017-08-01 DIAGNOSIS — Z3042 Encounter for surveillance of injectable contraceptive: Secondary | ICD-10-CM

## 2017-08-01 MED ORDER — MEDROXYPROGESTERONE ACETATE 150 MG/ML IM SUSP
150.0000 mg | Freq: Once | INTRAMUSCULAR | Status: AC
  Administered 2017-08-01: 150 mg via INTRAMUSCULAR

## 2017-08-01 NOTE — Progress Notes (Signed)
Patient is here for Depo Provera Injection Patient is within Depo Provera Calender Limits yes, last given 05/14/17  Next Depo Due between: 6/20-7/4 Last AEX: 11/21/16 BS AEX Scheduled: 01/13/18   Patient is aware when next depo is due  Pt tolerated Injection well in [place].   Routed to provider for review, encounter closed.

## 2017-10-22 ENCOUNTER — Other Ambulatory Visit: Payer: Self-pay

## 2017-10-22 ENCOUNTER — Ambulatory Visit (INDEPENDENT_AMBULATORY_CARE_PROVIDER_SITE_OTHER): Payer: BLUE CROSS/BLUE SHIELD

## 2017-10-22 VITALS — BP 122/80 | HR 70 | Resp 16 | Ht 62.0 in | Wt 209.0 lb

## 2017-10-22 DIAGNOSIS — Z3042 Encounter for surveillance of injectable contraceptive: Secondary | ICD-10-CM

## 2017-10-22 MED ORDER — MEDROXYPROGESTERONE ACETATE 150 MG/ML IM SUSP
150.0000 mg | Freq: Once | INTRAMUSCULAR | Status: AC
  Administered 2017-10-22: 150 mg via INTRAMUSCULAR

## 2017-10-22 NOTE — Progress Notes (Signed)
Patient is here for Depo Provera Injection Patient is within Depo Provera Calender Limits Yes last give 08/01/17 Next Depo Due between: 01/07/18- 01/21/18 Last AEX: 11/21/16  BS AEX Scheduled: 01/13/18, pt aware she can get depo at AEX appt.  Patient is aware when next depo is due  Pt tolerated Injection well RUOQ  Routed to provider for review, encounter closed.

## 2018-01-13 ENCOUNTER — Other Ambulatory Visit (HOSPITAL_COMMUNITY)
Admission: RE | Admit: 2018-01-13 | Discharge: 2018-01-13 | Disposition: A | Discharge status: Home / Self Care | Payer: BLUE CROSS/BLUE SHIELD | Source: Ambulatory Visit | Attending: Obstetrics and Gynecology | Admitting: Obstetrics and Gynecology

## 2018-01-13 ENCOUNTER — Ambulatory Visit (INDEPENDENT_AMBULATORY_CARE_PROVIDER_SITE_OTHER): Payer: BLUE CROSS/BLUE SHIELD | Admitting: Obstetrics and Gynecology

## 2018-01-13 ENCOUNTER — Encounter: Payer: Self-pay | Admitting: Obstetrics and Gynecology

## 2018-01-13 ENCOUNTER — Other Ambulatory Visit: Payer: Self-pay

## 2018-01-13 VITALS — BP 154/100 | HR 92 | Ht 62.0 in | Wt 213.0 lb

## 2018-01-13 DIAGNOSIS — Z3042 Encounter for surveillance of injectable contraceptive: Secondary | ICD-10-CM

## 2018-01-13 DIAGNOSIS — Z01419 Encounter for gynecological examination (general) (routine) without abnormal findings: Secondary | ICD-10-CM | POA: Insufficient documentation

## 2018-01-13 DIAGNOSIS — D219 Benign neoplasm of connective and other soft tissue, unspecified: Secondary | ICD-10-CM

## 2018-01-13 DIAGNOSIS — Z1272 Encounter for screening for malignant neoplasm of vagina: Secondary | ICD-10-CM | POA: Diagnosis not present

## 2018-01-13 MED ORDER — MEDROXYPROGESTERONE ACETATE 150 MG/ML IM SUSP
150.0000 mg | Freq: Once | INTRAMUSCULAR | Status: AC
  Administered 2018-01-13: 150 mg via INTRAMUSCULAR

## 2018-01-13 NOTE — Progress Notes (Signed)
Patient is here for Depo Provera Injection Patient is within Depo Provera Calender Limits last given 10/22/17  Next Depo Due between: Dec 2- Dec 16  Last AEX: 01/13/18 AEX Scheduled: 01/15/19  Patient is aware when next depo is due   Pt tolerated Injection well.  Routed to provider for review, encounter closed.

## 2018-01-13 NOTE — Patient Instructions (Signed)
EXERCISE AND DIET:  We recommended that you start or continue a regular exercise program for good health. Regular exercise means any activity that makes your heart beat faster and makes you sweat.  We recommend exercising at least 30 minutes per day at least 3 days a week, preferably 4 or 5.  We also recommend a diet low in fat and sugar.  Inactivity, poor dietary choices and obesity can cause diabetes, heart attack, stroke, and kidney damage, among others.    ALCOHOL AND SMOKING:  Women should limit their alcohol intake to no more than 7 drinks/beers/glasses of wine (combined, not each!) per week. Moderation of alcohol intake to this level decreases your risk of breast cancer and liver damage. And of course, no recreational drugs are part of a healthy lifestyle.  And absolutely no smoking or even second hand smoke. Most people know smoking can cause heart and lung diseases, but did you know it also contributes to weakening of your bones? Aging of your skin?  Yellowing of your teeth and nails?  CALCIUM AND VITAMIN D:  Adequate intake of calcium and Vitamin D are recommended.  The recommendations for exact amounts of these supplements seem to change often, but generally speaking 600 mg of calcium (either carbonate or citrate) and 800 units of Vitamin D per day seems prudent. Certain women may benefit from higher intake of Vitamin D.  If you are among these women, your doctor will have told you during your visit.    PAP SMEARS:  Pap smears, to check for cervical cancer or precancers,  have traditionally been done yearly, although recent scientific advances have shown that most women can have pap smears less often.  However, every woman still should have a physical exam from her gynecologist every year. It will include a breast check, inspection of the vulva and vagina to check for abnormal growths or skin changes, a visual exam of the cervix, and then an exam to evaluate the size and shape of the uterus and  ovaries.  And after 48 years of age, a rectal exam is indicated to check for rectal cancers. We will also provide age appropriate advice regarding health maintenance, like when you should have certain vaccines, screening for sexually transmitted diseases, bone density testing, colonoscopy, mammograms, etc.   MAMMOGRAMS:  All women over 40 years old should have a yearly mammogram. Many facilities now offer a "3D" mammogram, which may cost around $50 extra out of pocket. If possible,  we recommend you accept the option to have the 3D mammogram performed.  It both reduces the number of women who will be called back for extra views which then turn out to be normal, and it is better than the routine mammogram at detecting truly abnormal areas.    COLONOSCOPY:  Colonoscopy to screen for colon cancer is recommended for all women at age 50.  We know, you hate the idea of the prep.  We agree, BUT, having colon cancer and not knowing it is worse!!  Colon cancer so often starts as a polyp that can be seen and removed at colonscopy, which can quite literally save your life!  And if your first colonoscopy is normal and you have no family history of colon cancer, most women don't have to have it again for 10 years.  Once every ten years, you can do something that may end up saving your life, right?  We will be happy to help you get it scheduled when you are ready.    Be sure to check your insurance coverage so you understand how much it will cost.  It may be covered as a preventative service at no cost, but you should check your particular policy.     Total Laparoscopic Hysterectomy A total laparoscopic hysterectomy is a minimally invasive surgery to remove your uterus and cervix. This surgery is performed by making several small cuts (incisions) in your abdomen. It can also be done with a thin, lighted tube (laparoscope) inserted into two small incisions in your lower abdomen. Your fallopian tubes and ovaries can be  removed (bilateral salpingo-oophorectomy) during this surgery as well.Benefits of minimally invasive surgery include:  Less pain.  Less risk of blood loss.  Less risk of infection.  Quicker return to normal activities.  Tell a health care provider about:  Any allergies you have.  All medicines you are taking, including vitamins, herbs, eye drops, creams, and over-the-counter medicines.  Any problems you or family members have had with anesthetic medicines.  Any blood disorders you have.  Any surgeries you have had.  Any medical conditions you have. What are the risks? Generally, this is a safe procedure. However, as with any procedure, complications can occur. Possible complications include:  Bleeding.  Blood clots in the legs or lung.  Infection.  Injury to surrounding organs.  Problems with anesthesia.  Early menopause symptoms (hot flashes, night sweats, insomnia).  Risk of conversion to an open abdominal incision.  What happens before the procedure?  Ask your health care provider about changing or stopping your regular medicines.  Do not take aspirin or blood thinners (anticoagulants) for 1 week before the surgery or as told by your health care provider.  Do not eat or drink anything for 8 hours before the surgery or as told by your health care provider.  Quit smoking if you smoke.  Arrange for a ride home after surgery and for someone to help you at home during recovery. What happens during the procedure?  You will be given antibiotic medicine.  An IV tube will be placed in your arm. You will be given medicine to make you sleep (general anesthetic).  A gas (carbon dioxide) will be used to inflate your abdomen. This will allow your surgeon to look inside your abdomen, perform your surgery, and treat any other problems found if necessary.  Three or four small incisions (often less than 1/2 inch) will be made in your abdomen. One of these incisions will  be made in the area of your belly button (navel). The laparoscope will be inserted into the incision. Your surgeon will look through the laparoscope while doing your procedure.  Other surgical instruments will be inserted through the other incisions.  Your uterus may be removed through your vagina or cut into small pieces and removed through the small incisions.  Your incisions will be closed. What happens after the procedure?  The gas will be released from inside your abdomen.  You will be taken to the recovery area where a nurse will watch and check your progress. Once you are awake, stable, and taking fluids well, without other problems, you will return to your room or be allowed to go home.  There is usually minimal discomfort following the surgery because the incisions are so small.  You will be given pain medicine while you are in the hospital and for when you go home. This information is not intended to replace advice given to you by your health care provider. Make sure you discuss  any questions you have with your health care provider. Document Released: 02/11/2007 Document Revised: 09/22/2015 Document Reviewed: 11/04/2012 Elsevier Interactive Patient Education  2017 [place]. Leuprolide depot injection What is this medicine? LEUPROLIDE (loo PROE lide) is a man-made protein that acts like a natural hormone in the body. It decreases testosterone in men and decreases estrogen in women. In men, this medicine is used to treat advanced prostate cancer. In women, some forms of this medicine may be used to treat endometriosis, uterine fibroids, or other female hormone-related problems. This medicine may be used for other purposes; ask your health care provider or pharmacist if you have questions. COMMON BRAND NAME(S): Eligard, Lupron Depot, Lupron Depot-Ped, Viadur What should I tell my health care provider before I take this medicine? They need to know if you have any of these  conditions: -diabetes -heart disease or previous heart attack -high blood pressure -high cholesterol -mental illness -osteoporosis -pain or difficulty passing urine -seizures -spinal cord metastasis -stroke -suicidal thoughts, plans, or attempt; a previous suicide attempt by you or a family member -tobacco smoker -unusual vaginal bleeding (women) -an unusual or allergic reaction to leuprolide, benzyl alcohol, other medicines, foods, dyes, or preservatives -pregnant or trying to get pregnant -breast-feeding How should I use this medicine? This medicine is for injection into a muscle or for injection under the skin. It is given by a health care professional in a hospital or clinic setting. The specific product will determine how it will be given to you. Make sure you understand which product you receive and how often you will receive it. Talk to your pediatrician regarding the use of this medicine in children. Special care may be needed. Overdosage: If you think you have taken too much of this medicine contact a poison control center or emergency room at once. NOTE: This medicine is only for you. Do not share this medicine with others. What if I miss a dose? It is important not to miss a dose. Call your doctor or health care professional if you are unable to keep an appointment. Depot injections: Depot injections are given either once-monthly, every 12 weeks, every 16 weeks, or every 24 weeks depending on the product you are prescribed. The product you are prescribed will be based on if you are female or female, and your condition. Make sure you understand your product and dosing. What may interact with this medicine? Do not take this medicine with any of the following medications: -chasteberry This medicine may also interact with the following medications: -herbal or dietary supplements, like black cohosh or DHEA -female hormones, like estrogens or progestins and birth control pills,  patches, rings, or injections -female hormones, like testosterone This list may not describe all possible interactions. Give your health care provider a list of all the medicines, herbs, non-prescription drugs, or dietary supplements you use. Also tell them if you smoke, drink alcohol, or use illegal drugs. Some items may interact with your medicine. What should I watch for while using this medicine? Visit your doctor or health care professional for regular checks on your progress. During the first weeks of treatment, your symptoms may get worse, but then will improve as you continue your treatment. You may get hot flashes, increased bone pain, increased difficulty passing urine, or an aggravation of nerve symptoms. Discuss these effects with your doctor or health care professional, some of them may improve with continued use of this medicine. Female patients may experience a menstrual cycle or spotting during the first  months of therapy with this medicine. If this continues, contact your doctor or health care professional. What side effects may I notice from receiving this medicine? Side effects that you should report to your doctor or health care professional as soon as possible: -allergic reactions like skin rash, itching or hives, swelling of the face, lips, or tongue -breathing problems -chest pain -depression or memory disorders -pain in your legs or groin -pain at site where injected or implanted -seizures -severe headache -swelling of the feet and legs -suicidal thoughts or other mood changes -visual changes -vomiting Side effects that usually do not require medical attention (report to your doctor or health care professional if they continue or are bothersome): -breast swelling or tenderness -decrease in sex drive or performance -diarrhea -hot flashes -loss of appetite -muscle, joint, or bone pains -nausea -redness or irritation at site where injected or implanted -skin problems  or acne This list may not describe all possible side effects. Call your doctor for medical advice about side effects. You may report side effects to FDA at 1-800-FDA-1088. Where should I keep my medicine? This drug is given in a hospital or clinic and will not be stored at home. NOTE: This sheet is a summary. It may not cover all possible information. If you have questions about this medicine, talk to your doctor, pharmacist, or health care provider.  2018 Elsevier/Gold Standard (2015-09-29 09:45:53)  Uterine Artery Embolization for Fibroids Uterine artery embolization is a nonsurgical treatment to shrink fibroids. A thin plastic tube (catheter) is used to inject material that blocks off the blood supply to the fibroid, which causes the fibroid to shrink. Tell a health care provider about:  Any allergies you have.  All medicines you are taking, including vitamins, herbs, eye drops, creams, and over-the-counter medicines.  Any problems you or family members have had with anesthetic medicines.  Any blood disorders you have.  Any surgeries you have had.  Any medical conditions you have. What are the risks?  Injury to the uterus from decreased blood supply  Infection.  Blood infection (septicemia).  Lack of menstrual periods (amenorrhea).  Death of tissue cells (necrosis) around your bladder or vulva.  Development of a hole between organs or from an organ to the surface of your skin (fistula).  Blood clot in the legs (deep vein thrombosis) or lung (pulmonary embolus). What happens before the procedure?  Ask your health care provider about changing or stopping your regular medicines.  Do not take aspirin or blood thinners (anticoagulants) for 1 week before the surgery or as directed by your health care provider.  Do not eat or drink anything for 8 hours before the surgery or as directed by your health care provider.  Empty your bladder before the procedure begins. What  happens during the procedure?  An IV tube will be placed into one of your veins. This will be used to give you a sedative and pain medication (conscious sedation).  You will be given a medicine that numbs the area (local anesthetic).  A small cut will be made in your groin. A catheter is then inserted into the main artery of your leg.  The catheter will be guided through the artery to your uterus. A series of images will be taken while dye is injected through the catheter in your groin. X-rays are taken at the same time. This is done to provide a road map of the blood supply to your uterus and fibroids.  Tiny plastic spheres, about the  size of sand grains, will be injected through the catheter. Metal coils may be used to help block the artery. The particles will lodge in tiny branches of the uterine artery that supplies blood to the fibroids.  The procedure is repeated on the artery that supplies the other side of the uterus.  The catheter is then removed and pressure is held to stop any bleeding. No stitches are needed.  A dressing is then placed over the cut (incision). What happens after the procedure?  You will be taken to a recovery area where your progress will be monitored until you are awake, stable, and taking fluids well. If there are no other problems, you will then be moved to a regular hospital room.  You will be observed overnight in the hospital.  You will have cramping that should be controlled with pain medication. This information is not intended to replace advice given to you by your health care provider. Make sure you discuss any questions you have with your health care provider. Document Released: 07/02/2005 Document Revised: 09/22/2015 Document Reviewed: 10/30/2012 Elsevier Interactive Patient Education  [employer].

## 2018-01-13 NOTE — Progress Notes (Signed)
47 y.o. G80P2012 Married [place] American female here for annual exam.    On Depo Provera for control of fibroids.  On time injections.   Still having her menses.  In June - July, kept spotting and bleeding most of the month.  In August had a heavy menses and lasted a full week. Blood running out per patient.  In September had a menses for 7 days.  Did have some heavy flow and changed overnight pads 2 -3 times per day.   No dysmenorrhea and no pelvic pain.   Gaining weight.   Decreased sex drive.   Tired.   Has completed childbearing.  Is ready for her menses not to rule her life.   PCP:   No PCP.  No LMP recorded. Patient has had an injection.           Sexually active: Yes.    The current method of family planning is Depo-Provera injections.   Vasectomy.  Exercising: No.  The patient does not participate in regular exercise at present. Smoker:  no  Health Maintenance: Pap:  12/15/2015 - normal, neg HR HPV.  [facility]. History of abnormal Pap:  no MMG:  12/07/2016 - normal - BI-RADS1. Colonoscopy:  none BMD:   n/a  Result  n/a TDaP:  unsure Gardasil:   no HIV: negative Hep C: negative Screening Labs:   ---   reports that she has never smoked. She has never used smokeless tobacco. She reports that she does not drink alcohol or use drugs.  Past Medical History:  Diagnosis Date  . Abnormal uterine bleeding   . Anemia   . Anxiety   . Fibroid   . Superficial thrombophlebitis 2005   One episode.  Treated with ASA.    Past Surgical History:  Procedure Laterality Date  . CESAREAN SECTION    . HERNIA REPAIR     supraumbilical herniorrhaphy after birth of second child.  [facility] [place].   No mesh per patient.  . OOPHORECTOMY Left     Current Outpatient Medications  Medication Sig Dispense Refill  . ferrous sulfate (SLOW FE) 160 (50 Fe) MG TBCR SR tablet Take 160 mg by mouth daily.    . medroxyPROGESTERone (DEPO-PROVERA) 150 MG/ML injection Inject 150 mg  into the muscle every 3 (three) months.     No current facility-administered medications for this visit.     Family History  Problem Relation Age of Onset  . Hypertension Mother   . Hyperlipidemia Mother   . Hypertension Father   . Hyperlipidemia Father   . Congestive Heart Failure Father     Review of Systems  Constitutional: Positive for unexpected weight change.  HENT: Negative.   Eyes: Negative.   Respiratory: Negative.   Cardiovascular: Negative.   Gastrointestinal: Negative.   Endocrine:       Craving sweets  Genitourinary: Negative.   Musculoskeletal: Negative.   Skin: Negative.   Allergic/Immunologic: Negative.   Neurological: Negative.   Hematological: Negative.   Psychiatric/Behavioral: Negative.   All other systems reviewed and are negative.   Exam:   BP (!) 154/100   Pulse 92   Ht 5\' 2"  (1.575 m)   Wt 213 lb (96.6 kg)   BMI 38.96 kg/m     General appearance: alert, cooperative and appears stated age Head: Normocephalic, without obvious abnormality, atraumatic Neck: no adenopathy, supple, symmetrical, trachea midline and thyroid normal to inspection and palpation Lungs: clear to auscultation bilaterally Breasts: normal appearance, no masses or  tenderness, No nipple retraction or dimpling, No nipple discharge or bleeding, No axillary or supraclavicular adenopathy Heart: regular rate and rhythm Abdomen:  Pfannensteil incision and vertical supraumbilical incision.  Abdomen is soft, non-tender; no masses, no organomegaly.  Extremities: extremities normal, atraumatic, no cyanosis or edema Skin: Skin color, texture, turgor normal. No rashes or lesions Lymph nodes: Cervical, supraclavicular, and axillary nodes normal. No abnormal inguinal nodes palpated Neurologic: Grossly normal  Pelvic: External genitalia:  no lesions              Urethra:  normal appearing urethra with no masses, tenderness or lesions              Bartholins and Skenes: normal                  Vagina: normal appearing vagina with normal color and discharge, no lesions              Cervix: no lesions              Pap taken: Yes.   Bimanual Exam:  Uterus:  14 week size.               Adnexa: no mass, fullness, tenderness              Rectal exam: Yes.  .  Confirms.              Anus:  normal sphincter tone, no lesions  Chaperone was present for exam.  Assessment:   Well woman visit with normal exam. Fibroids.  On Depo Provera.  Status post left oophorectomy.  Hx chronic endometritis.  Hx superficial thrombophlebitis.  Supraumbilical herniorrhaphy.  Vasectomy for contraception.   Plan: Mammogram screening.  She will schedule.  Recommended self breast awareness. Pap and HR HPV as above. Guidelines for Calcium, Vitamin D, regular exercise program including cardiovascular and weight bearing exercise. Depo Provera 150 mg IM today and every 3 months for one year.  Routine labs. We discussed alternatives to Depo Provera - uterine artery embolization, Depo Lupron and then laparoscopic hysterectomy with bilateral salpingectomy.   Written information also given to patient.  She may lean toward uterine artery embolization.  Follow up annually and prn.   After visit summary provided.

## 2018-01-14 LAB — LIPID PANEL
CHOLESTEROL TOTAL: 193 mg/dL (ref 100–199)
Chol/HDL Ratio: 4.1 ratio (ref 0.0–4.4)
HDL: 47 mg/dL (ref >39)
LDL CALC: 121 mg/dL — AB (ref 0–99)
Triglycerides: 123 mg/dL (ref 0–149)
VLDL Cholesterol Cal: 25 mg/dL (ref 5–40)

## 2018-01-14 LAB — COMPREHENSIVE METABOLIC PANEL
ALBUMIN: 4.4 g/dL (ref 3.5–5.5)
ALT: 21 IU/L (ref 0–32)
AST: 20 IU/L (ref 0–40)
Albumin/Globulin Ratio: 1.7 (ref 1.2–2.2)
Alkaline Phosphatase: 56 IU/L (ref 39–117)
BILIRUBIN TOTAL: 0.4 mg/dL (ref 0.0–1.2)
BUN / CREAT RATIO: 6 — AB (ref 9–23)
BUN: 4 mg/dL — AB (ref 6–24)
CALCIUM: 9.4 mg/dL (ref 8.7–10.2)
CO2: 18 mmol/L — AB (ref 20–29)
CREATININE: 0.67 mg/dL (ref 0.57–1.00)
Chloride: 102 mmol/L (ref 96–106)
GFR calc non Af Amer: 105 mL/min/{1.73_m2} (ref >59)
GFR, EST AFRICAN AMERICAN: 121 mL/min/{1.73_m2} (ref >59)
GLUCOSE: 96 mg/dL (ref 65–99)
Globulin, Total: 2.6 g/dL (ref 1.5–4.5)
Potassium: 4 mmol/L (ref 3.5–5.2)
Sodium: 139 mmol/L (ref 134–144)
TOTAL PROTEIN: 7 g/dL (ref 6.0–8.5)

## 2018-01-14 LAB — CBC
HEMOGLOBIN: 13.2 g/dL (ref 11.1–15.9)
Hematocrit: 39.1 % (ref 34.0–46.6)
MCH: 31.3 pg (ref 26.6–33.0)
MCHC: 33.8 g/dL (ref 31.5–35.7)
MCV: 93 fL (ref 79–97)
Platelets: 298 10*3/uL (ref 150–450)
RBC: 4.22 x10E6/uL (ref 3.77–5.28)
RDW: 13.4 % (ref 12.3–15.4)
WBC: 9.7 10*3/uL (ref 3.4–10.8)

## 2018-01-14 LAB — TSH: TSH: 1.2 u[IU]/mL (ref 0.450–4.500)

## 2018-01-15 LAB — CYTOLOGY - PAP
Diagnosis: NEGATIVE
HPV: NOT DETECTED

## 2018-01-20 ENCOUNTER — Telehealth: Payer: Self-pay | Admitting: Obstetrics and Gynecology

## 2018-01-20 ENCOUNTER — Ambulatory Visit (INDEPENDENT_AMBULATORY_CARE_PROVIDER_SITE_OTHER): Payer: BLUE CROSS/BLUE SHIELD | Admitting: *Deleted

## 2018-01-20 VITALS — BP 150/86 | HR 96 | Resp 16 | Ht 62.0 in | Wt 205.0 lb

## 2018-01-20 DIAGNOSIS — R030 Elevated blood-pressure reading, without diagnosis of hypertension: Secondary | ICD-10-CM

## 2018-01-20 NOTE — Progress Notes (Signed)
Patient in today for BP recheck.  States she has to drive 1 hour to get here and has a lot of stressor going on right now.  She states she started to change her diet since last week and feels well.   BP today:  148/88 Right arm 150/86 Left arm 96 bpm  Advised to keep checking BP at a pharmacy close to her house and keep a log. If it still high she probably will need to see PCP.  Routed to Dr. Quincy Simmonds for further recommendations.   Encounter close.

## 2018-01-20 NOTE — Telephone Encounter (Signed)
Patient is ready to proceed with surgery.

## 2018-01-20 NOTE — Progress Notes (Signed)
I agree with your recommendations. 

## 2018-01-20 NOTE — Telephone Encounter (Signed)
Return call to patient. Left message to call back. 

## 2018-01-21 NOTE — Telephone Encounter (Signed)
Call to patient. States reviewed options with husband and has decided to proceed with surgery. States Dr Quincy Simmonds mentioned Lupron prior to surgery. Patient would like to have surgery as soon as possible.  Advised will update Dr Quincy Simmonds on patient decision and call back with recommendations. Will begin precert process.

## 2018-01-21 NOTE — Telephone Encounter (Signed)
Spoke with patient regarding benefit for surgery. Patient understood and agreeable. Patient has confirmed and is ready to proceed with schceduling in November.rPatient awares this is professional benefit only. Patient aware will be contacted by hospital for separate benefits.  Forwarding to Conservation officer, historic buildings for scheduling.   Routing to Lamont Snowball, RN

## 2018-01-21 NOTE — Telephone Encounter (Signed)
Patient is asking to talk with Gay Filler again, she has additional questions.

## 2018-01-21 NOTE — Telephone Encounter (Signed)
Patient returning call.

## 2018-01-24 NOTE — Telephone Encounter (Signed)
Patient's procedure will be a laparoscopic hysterectomy with bilateral salpingectomy and cystoscopy.  Hgb is normal.  No Depo Lupron needed.  She needs to see her PCP to get her blood pressure under control prior to surgery.  She also needs to update her mammogram.

## 2018-01-24 NOTE — Telephone Encounter (Signed)
Call to patient. Advised of recommendations from Dr Quincy Simmonds.  Given [facility] for [facility].  Discussed need for PCP management of BP and clearance for surgery. Patient voice understanding and will call for appointment.  Tentative surgery date of 03-18-18 discussed pending BP and MMG. Patient will call once appointments scheduled and ready to confirm date.  Encounter closed.

## 2018-01-29 ENCOUNTER — Telehealth: Payer: Self-pay | Admitting: Obstetrics and Gynecology

## 2018-01-29 NOTE — Telephone Encounter (Deleted)
Call to RX Benefits. Per [place], [place] number is [id number] valid until 02-21-18. Can have dispensed at any pharmacy.

## 2018-01-29 NOTE — Telephone Encounter (Signed)
Patient called stating that [place] is out of the country, but wanted to call and confirm surgery date of November 19th. Patient stated that the best way to contact her while she is out of the country is through [employer].

## 2018-01-30 ENCOUNTER — Other Ambulatory Visit: Payer: Self-pay | Admitting: Obstetrics and Gynecology

## 2018-01-30 DIAGNOSIS — Z1231 Encounter for screening mammogram for malignant neoplasm of breast: Secondary | ICD-10-CM

## 2018-01-30 NOTE — Telephone Encounter (Signed)
Called patient last night, reviewed message sent through My Chart.  Discussed November 19 surgery date and needed pre-op appointment with surgical clearance and MMG results.  Patient requests I schedule mammogram. She will see PCP when she returns from vacation.   Call to [facility]. Screening mammogram scheduled for first available appointment on 03-03-18 at 1pm, arrive at 1240. Will need to sign release to get previous films.  Will send update message to patient through My Chart. Marland Kitchen

## 2018-01-30 NOTE — Telephone Encounter (Signed)
Surgery is scheduled as patient requested. My Chart message sent with confirmation and requesting call back when she returns from vacation. Will need surgery instructions.   Routing to Dr Quincy Simmonds. Encounter closed.

## 2018-02-03 ENCOUNTER — Telehealth: Payer: Self-pay | Admitting: *Deleted

## 2018-02-03 NOTE — Telephone Encounter (Signed)
Call to patient. Surgery information form reviewed with patient and she verbalized understanding. Pre op appointment scheduled for 02-28-18 at 1300. Post op appointments scheduled for 03-26-18 at 1530 and 05-01-18 at 1530. Patient agreeable to date and time of all appointments. Aware will receive a copy of surgery information sheet at pre op appointment on 02-28-18. Patient agreeable.   Routing to provider and will close encounter.   Cc Lamont Snowball, RN

## 2018-02-26 NOTE — Patient Instructions (Addendum)
Your procedure is scheduled on: Tuesday, Nov 19  Enter through the Main Entrance of [facility] at: 6 am  Pick up the phone at the desk and dial [phone].  Call this number if you have problems the morning of surgery: [phone].  Remember: Do NOT eat food or Do NOT drink clear liquids (including water) after midnight Monday  Take these medicines the morning of surgery with a SIP OF WATER: None  Brush your teeth on the day of surgery.  Stop herbal medications, vitamin supplements, Ibuprofen/NSAIDS 1 week prior to surgery-03/11/18.  Do NOT wear jewelry (body piercing), metal hair clips/bobby pins, make-up, or nail polish. Do NOT wear lotions, powders, or perfumes.  You may wear deoderant. Do NOT shave for 48 hours prior to surgery. Do NOT bring valuables to the hospital.  Leave suitcase in car.  After surgery it may be brought to your room.  For patients admitted to the hospital, checkout time is 11:00 AM the day of discharge. Have a responsible adult drive you home and stay with you for 24 hours after your procedure.  Home with Husband Melanie Hopkins cell [phone].

## 2018-02-27 NOTE — Progress Notes (Signed)
GYNECOLOGY  VISIT   HPI: 47 y.o.   Married  [place] American  female   [phone] with Patient's last menstrual period was 02/26/2018 (exact date).   here for surgical consult.  Husband present for the entire visit today.  Using Depo Provera for control of fibroids and bleeding.  Still has some prolonged bleeding.   Has 4 fibroids ranging from 1.76 to 5.72 cm.  Sonohysterogram showed multiple filling defects which looks like scalloping along of the endometrial cavity.  EMB showed degenerating endometrium.   Has gained 30 - 40 pounds on Depo Provera.  Declines future childbearing.   States her PCP is following her blood pressure closely and is determining if she needs anti HTN or not.  Desires hysterectomy.   GYNECOLOGIC HISTORY: Patient's last menstrual period was 02/26/2018 (exact date). Contraception: Vasectomy Menopausal hormone therapy:  none Last mammogram: 12-07-16 Neg/BiRads1--Appt.03-03-18 Last pap smear: 01-13-18 Neg:Neg HR HPV        OB History    Gravida  3   Para  2   Term  2   Preterm  0   AB  1   Living  2     SAB  0   TAB  0   Ectopic  0   Multiple  0   Live Births  2              Patient Active Problem List   Diagnosis Date Noted  . Fibroids 11/21/2016    Past Medical History:  Diagnosis Date  . Abnormal uterine bleeding   . Anemia   . Anxiety   . Fibroid   . Superficial thrombophlebitis 2005   One episode.  Treated with ASA.    Past Surgical History:  Procedure Laterality Date  . CESAREAN SECTION    . HERNIA REPAIR     supraumbilical herniorrhaphy after birth of second child.  [facility] [place].   No mesh per patient.  . OOPHORECTOMY Left     Current Outpatient Medications  Medication Sig Dispense Refill  . Ferrous Sulfate (SLOW FE) 142 (45 Fe) MG TBCR Take 142 mg by mouth daily.    . medroxyPROGESTERone (DEPO-PROVERA) 150 MG/ML injection Inject 150 mg into the muscle every 3 (three) months.     No current  facility-administered medications for this visit.      ALLERGIES: Patient has no known allergies.  Family History  Problem Relation Age of Onset  . Hypertension Mother   . Hyperlipidemia Mother   . Hypertension Father   . Hyperlipidemia Father   . Congestive Heart Failure Father     Social History   Socioeconomic History  . Marital status: Married    Spouse name: Not on file  . Number of children: Not on file  . Years of education: Not on file  . Highest education level: Not on file  Occupational History  . Not on file  Social Needs  . Financial resource strain: Not on file  . Food insecurity:    Worry: Not on file    Inability: Not on file  . Transportation needs:    Medical: Not on file    Non-medical: Not on file  Tobacco Use  . Smoking status: Never Smoker  . Smokeless tobacco: Never Used  Substance and Sexual Activity  . Alcohol use: No  . Drug use: No  . Sexual activity: Yes    Partners: Male    Birth control/protection: Other-see comments, Injection    Comment: husband  with vasectomy,depo  Lifestyle  . Physical activity:    Days per week: Not on file    Minutes per session: Not on file  . Stress: Not on file  Relationships  . Social connections:    Talks on phone: Not on file    Gets together: Not on file    Attends religious service: Not on file    Active member of club or organization: Not on file    Attends meetings of clubs or organizations: Not on file    Relationship status: Not on file  . Intimate partner violence:    Fear of current or ex partner: Not on file    Emotionally abused: Not on file    Physically abused: Not on file    Forced sexual activity: Not on file  Other Topics Concern  . Not on file  Social History Narrative  . Not on file    Review of Systems  All other systems reviewed and are negative.   PHYSICAL EXAMINATION:    BP 140/82 (BP Location: Right Arm, Patient Position: Sitting, Cuff Size: Large)   Pulse (!) 104    Resp 20   Ht 5\' 2"  (1.575 m)   Wt 212 lb 6.4 oz (96.3 kg)   LMP 02/26/2018 (Exact Date)   BMI 38.85 kg/m     General appearance: alert, cooperative and appears stated age Head: Normocephalic, without obvious abnormality, atraumatic Neck: no adenopathy, supple, symmetrical, trachea midline and thyroid normal to inspection and palpation Lungs: clear to auscultation bilaterally Heart: regular rate and rhythm Abdomen: soft, non-tender, no masses,  no organomegaly Extremities: extremities normal, atraumatic, no cyanosis or edema Skin: Skin color, texture, turgor normal. No rashes or lesions Lymph nodes: Cervical, supraclavicular, and axillary nodes normal. No abnormal inguinal nodes palpated Neurologic: Grossly normal  Pelvic: External genitalia:  no lesions              Urethra:  normal appearing urethra with no masses, tenderness or lesions              Bartholins and Skenes: normal                 Vagina: normal appearing vagina with normal color and discharge, no lesions              Cervix: no lesions                Bimanual Exam:  Uterus: 14 week size, non-tender              Adnexa: no mass, fullness, tenderness          Chaperone was present for exam.  ASSESSMENT  Fibroids.  On Depo Provera.  Status post left oophorectomy.  Hx chronic endometritis.  Hx superficial thrombophlebitis.  Supraumbilical herniorrhaphy.  Vasectomy for contraception.   Elevated blood pressure a prior office visit.   PLAN  I discussed total laparoscopic hysterectomy with bilateral salpingectomy and possible right oophorectomy, cystoscopy, vaginal morcellation of uterine fibroids.   I reviewed  Risks, benefits, and alternatives.   Risks include but are not limited to bleeding, infection, damage to surrounding organs, pneumonia, reaction to anesthesia, DVT, PE, death, need for reoperation, hernia formation, vaginal cuff dehiscence, need to convert to a traditional laparotomy incision to complete  the procedure.   Surgical procedure and expectations reviewed.  I did discuss left upper quadrant trocar placement due to possible infraumbilical adhesions.  Will plan for vaginal morcellation of fibroids in [place] bag as  needed.  Patient wishes to proceed.  She will do a magnesium citrate bowel prep.  She will receive Lovenox for DVT prophylaxis.    An After Visit Summary was printed and given to the patient.  ___25___ minutes face to face time of which over 50% was spent in counseling.

## 2018-02-28 ENCOUNTER — Ambulatory Visit (INDEPENDENT_AMBULATORY_CARE_PROVIDER_SITE_OTHER): Payer: BLUE CROSS/BLUE SHIELD | Admitting: Obstetrics and Gynecology

## 2018-02-28 ENCOUNTER — Other Ambulatory Visit: Payer: Self-pay

## 2018-02-28 ENCOUNTER — Encounter: Payer: Self-pay | Admitting: Obstetrics and Gynecology

## 2018-02-28 VITALS — BP 140/82 | HR 104 | Resp 20 | Ht 62.0 in | Wt 212.4 lb

## 2018-02-28 DIAGNOSIS — D219 Benign neoplasm of connective and other soft tissue, unspecified: Secondary | ICD-10-CM

## 2018-02-28 DIAGNOSIS — Z0289 Encounter for other administrative examinations: Secondary | ICD-10-CM

## 2018-03-03 ENCOUNTER — Ambulatory Visit
Admission: RE | Admit: 2018-03-03 | Discharge: 2018-03-03 | Disposition: A | Discharge status: Home / Self Care | Payer: BLUE CROSS/BLUE SHIELD | Source: Ambulatory Visit | Attending: Obstetrics and Gynecology | Admitting: Obstetrics and Gynecology

## 2018-03-03 DIAGNOSIS — Z1231 Encounter for screening mammogram for malignant neoplasm of breast: Secondary | ICD-10-CM

## 2018-03-05 ENCOUNTER — Encounter (HOSPITAL_COMMUNITY)
Admission: RE | Admit: 2018-03-05 | Discharge: 2018-03-05 | Disposition: A | Discharge status: Home / Self Care | Payer: BLUE CROSS/BLUE SHIELD | Source: Ambulatory Visit | Attending: Obstetrics and Gynecology | Admitting: Obstetrics and Gynecology

## 2018-03-05 ENCOUNTER — Telehealth: Payer: Self-pay | Admitting: Obstetrics and Gynecology

## 2018-03-05 ENCOUNTER — Encounter (HOSPITAL_COMMUNITY): Payer: Self-pay

## 2018-03-05 ENCOUNTER — Telehealth: Payer: Self-pay | Admitting: *Deleted

## 2018-03-05 ENCOUNTER — Other Ambulatory Visit: Payer: Self-pay

## 2018-03-05 DIAGNOSIS — Z01812 Encounter for preprocedural laboratory examination: Secondary | ICD-10-CM | POA: Diagnosis present

## 2018-03-05 HISTORY — DX: Major depressive disorder, single episode, unspecified: F32.9

## 2018-03-05 HISTORY — DX: Hyperlipidemia, unspecified: E78.5

## 2018-03-05 HISTORY — DX: Depression, unspecified: F32.A

## 2018-03-05 LAB — TYPE AND SCREEN
ABO/RH(D): AB POS
ANTIBODY SCREEN: NEGATIVE

## 2018-03-05 LAB — ABO/RH: ABO/RH(D): AB POS

## 2018-03-05 LAB — BASIC METABOLIC PANEL
Anion gap: 9 (ref 5–15)
CHLORIDE: 104 mmol/L (ref 98–111)
CO2: 23 mmol/L (ref 22–32)
CREATININE: 0.8 mg/dL (ref 0.44–1.00)
Calcium: 9.2 mg/dL (ref 8.9–10.3)
GFR calc Af Amer: >60 mL/min (ref >60)
GFR calc non Af Amer: >60 mL/min (ref >60)
Glucose, Bld: 145 mg/dL — ABNORMAL HIGH (ref 70–99)
Potassium: 3.9 mmol/L (ref 3.5–5.1)
SODIUM: 136 mmol/L (ref 135–145)

## 2018-03-05 LAB — CBC
HEMATOCRIT: 43.4 % (ref 36.0–46.0)
Hemoglobin: 14.4 g/dL (ref 12.0–15.0)
MCH: 31.2 pg (ref 26.0–34.0)
MCHC: 33.2 g/dL (ref 30.0–36.0)
MCV: 94.1 fL (ref 80.0–100.0)
Platelets: 324 10*3/uL (ref 150–400)
RBC: 4.61 MIL/uL (ref 3.87–5.11)
RDW: 12.8 % (ref 11.5–15.5)
WBC: 9 10*3/uL (ref 4.0–10.5)
nRBC: 0 % (ref 0.0–0.2)

## 2018-03-05 NOTE — Telephone Encounter (Signed)
Late Entry, 1:00pm: Patient walked into office. Patient states she just left her pre-op visit at [facility] and her B/P was elevated. Patient is scheduled for a TLH on 03/18/18 with Dr. Quincy Simmonds. Patient states she thinks anxiety may be contributing to her increased B/P. B/P is being followed by her PCP, Elisabeth Most, FNP at [facility]. Reviewed with Dr. Quincy Simmonds, recommended patient f/u with PCP for further evaluation and management of HTN.     Reviewed plan with patient. Patient states she was advised by PCP to monitor B/P and return call this week to provide update, will then determine if HTN medication is needed prior to surgery. Patient reports B/P: 03/01/18 at 1:14pm 170/100 03/03/18 at 9:54pm 169/85 03/04/18 at 10:30pm 178/105  This RN called PCP office, spoke with receptionist, was advised first available OV 03/13/18. Advised patient is scheduled for surgery on 11/19, was advised to f/u with PCP this week, requested to speak with nurse. Spoke with Levada Dy was advised PCP is out of the office today, will return on 03/06/18, fax B/P to [phone]. I was advised their office will contact patient directly on 11/7 after B/P has been reviewed by PCP. Patient made aware of plan, verbalizes understanding and is agreeable.

## 2018-03-05 NOTE — Telephone Encounter (Signed)
Copy of today's note and Pre-op B/P faxed to PCP/ Elisabeth Most, FNP at [phone].   Routing to Dr. Quincy Simmonds.  Will close encounter.

## 2018-03-05 NOTE — Telephone Encounter (Signed)
Return call to patient. Left message to call back. 

## 2018-03-05 NOTE — Telephone Encounter (Signed)
Call from patient. Clarification regarding PAT  appointment provided. Encounter closed.

## 2018-03-05 NOTE — Telephone Encounter (Signed)
Patient is calling regarding pre-op appointment today at 11:30am.

## 2018-03-07 ENCOUNTER — Other Ambulatory Visit: Payer: Self-pay | Admitting: Obstetrics and Gynecology

## 2018-03-07 ENCOUNTER — Other Ambulatory Visit: Payer: Self-pay | Admitting: *Deleted

## 2018-03-07 ENCOUNTER — Telehealth: Payer: Self-pay | Admitting: Obstetrics and Gynecology

## 2018-03-07 ENCOUNTER — Telehealth: Payer: Self-pay | Admitting: *Deleted

## 2018-03-07 DIAGNOSIS — R739 Hyperglycemia, unspecified: Secondary | ICD-10-CM

## 2018-03-07 LAB — HEMOGLOBIN A1C
HEMOGLOBIN A1C: 5 % (ref 4.8–5.6)
MEAN PLASMA GLUCOSE: 96.8 mg/dL

## 2018-03-07 NOTE — Telephone Encounter (Signed)
Patient notified of pre op lab results. See result note.  Patient has not received call back from PCP regarding BP management. Call to [facility]. Per Vicente Males, records not recevied. Patient is not established at their practice. She has been seen once as "walk-in." first available appointment is December 04-04-18.  Reviewed with Dr Quincy Simmonds. New appointment made with [facility], Dr Rogers Blocker, Monday 03-10-18 at 0905. Patient very pleased with new appointment.   Routing to Dr Quincy Simmonds. Encounter closed.

## 2018-03-07 NOTE — Progress Notes (Signed)
Opened in error

## 2018-03-07 NOTE — Telephone Encounter (Signed)
Patient is calling regarding her FMLA forms. She is asking to talk with Deloris Ping stating the forms need to be submitted on Monday 03/10/2018.  Patient is aware Deloris Ping is out of the office and will not receive a return call until Monday.

## 2018-03-10 ENCOUNTER — Ambulatory Visit: Payer: BLUE CROSS/BLUE SHIELD | Admitting: Family Medicine

## 2018-03-10 ENCOUNTER — Encounter: Payer: Self-pay | Admitting: Family Medicine

## 2018-03-10 VITALS — BP 170/84 | HR 138 | Temp 98.4°F | Ht 62.0 in | Wt 209.0 lb

## 2018-03-10 DIAGNOSIS — Z0000 Encounter for general adult medical examination without abnormal findings: Secondary | ICD-10-CM | POA: Diagnosis not present

## 2018-03-10 DIAGNOSIS — F418 Other specified anxiety disorders: Secondary | ICD-10-CM | POA: Diagnosis not present

## 2018-03-10 DIAGNOSIS — I10 Essential (primary) hypertension: Secondary | ICD-10-CM

## 2018-03-10 LAB — MICROALBUMIN / CREATININE URINE RATIO
Creatinine,U: 56.4 mg/dL
MICROALB UR: 0.9 mg/dL (ref 0.0–1.9)
Microalb Creat Ratio: 1.7 mg/g (ref 0.0–30.0)

## 2018-03-10 MED ORDER — HYDROXYZINE HCL 25 MG PO TABS: 25.0000 mg | ORAL_TABLET | Freq: Three times a day (TID) | ORAL | 0 refills | Status: DC | PRN

## 2018-03-10 MED ORDER — HYDROCHLOROTHIAZIDE 12.5 MG PO CAPS: 12.5000 mg | ORAL_CAPSULE | Freq: Every day | ORAL | 0 refills | Status: DC

## 2018-03-10 NOTE — Progress Notes (Signed)
Patient: Melanie Hopkins MRN: [id number] DOB: 05/10/1970 PCP: Orma Flaming, MD     Subjective:  Chief Complaint  Patient presents with  . Establish Care    HPI: The patient is a 47 y.o. female who presents today for annual exam. She denies any changes to past medical history. There have been no recent hospitalizations. They are following a well balanced diet and exercise plan. Weight has been increasing steadily. No complaints today.   Elevated blood pressure: She has been seeing Gyn and has had a trend upward of her blood pressure. She is having a hysterectomy on 03/18/2018 for fibroids. She is going to leave her one ovary if they can. She has never had an issue with her blood pressure until a few weeks.   Anxiety: She feels like this upcoming surgery has really increased her anxiety. She can tell when she has anxiety as she gets sweaty hands, fast heart rate and it sometimes wakes her up in the middle of the night. It all started when they told her a few weeks ago that her blood pressure was elevated. Every time she thinks about her blood pressure or checks it she gets anxiety.    There is no immunization history on file for this patient.  Colonoscopy: at age 34 Mammogram: utd  Pap smear: 2019 Hiv: done  Tdap: 03/10/2018 Flu: declined   Review of Systems  Constitutional: Negative for chills, fatigue and fever.  HENT: Negative for dental problem, ear pain, hearing loss and trouble swallowing.   Eyes: Negative for visual disturbance.  Respiratory: Negative for cough, chest tightness and shortness of breath.   Cardiovascular: Negative for chest pain, palpitations and leg swelling.  Gastrointestinal: Negative for abdominal pain, blood in stool, diarrhea and nausea.  Endocrine: Negative for cold intolerance, polydipsia, polyphagia and polyuria.  Genitourinary: Negative for dysuria and hematuria.  Musculoskeletal: Negative for arthralgias, back pain and neck pain.  Skin:  Negative for rash.  Neurological: Negative for dizziness and headaches.  Psychiatric/Behavioral: Positive for sleep disturbance. Negative for dysphoric mood. The patient is nervous/anxious.     Allergies Patient has No Known Allergies.  Past Medical History Patient  has a past medical history of Abnormal uterine bleeding, Anemia, Anxiety, Depression, Fibroid, Hyperlipidemia, Superficial thrombophlebitis (2005), and SVD (spontaneous vaginal delivery) (10/1991).  Surgical History Patient  has a past surgical history that includes Cesarean section (04/1988); Hernia repair; Oophorectomy (Left); and Wisdom tooth extraction.  Family History Pateint's family history includes Congestive Heart Failure in her father; Hyperlipidemia in her father and mother; Hypertension in her father and mother.  Social History Patient  reports that she has never smoked. She has never used smokeless tobacco. She reports that she does not drink alcohol or use drugs.    Objective: Vitals:   03/10/18 0932 03/10/18 1009  BP: (!) 182/102 (!) 170/84  Pulse: (!) 138   Temp: 98.4 F (36.9 C)   TempSrc: Oral   SpO2: 98%   Weight: 209 lb (94.8 kg)   Height: 5\' 2"  (1.575 m)     Body mass index is 38.23 kg/m.  Physical Exam  Constitutional: She is oriented to person, place, and time. She appears well-developed and well-nourished.  HENT:  Right Ear: External ear normal.  Left Ear: External ear normal.  Mouth/Throat: Oropharynx is clear and moist.  Eyes: Pupils are equal, round, and reactive to light. Conjunctivae and EOM are normal.  Neck: Normal range of motion. Neck supple. No thyromegaly present.  Cardiovascular: Normal rate, regular rhythm,  normal heart sounds and intact distal pulses.  No murmur heard. Pulmonary/Chest: Effort normal and breath sounds normal.  Abdominal: Soft. Bowel sounds are normal. She exhibits no distension. There is no tenderness.  Lymphadenopathy:    She has no cervical  adenopathy.  Neurological: She is alert and oriented to person, place, and time. She displays normal reflexes. No cranial nerve deficit. Coordination normal.  Skin: Skin is warm and dry. No rash noted.  Psychiatric: She has a normal mood and affect. Her behavior is normal.  Vitals reviewed.  Ekg: sinus tachycardia  GAD 7 : Generalized Anxiety Score 03/10/2018  Nervous, Anxious, on Edge 2  Control/stop worrying 3  Worry too much - different things 3  Trouble relaxing 3  Restless 1  Easily annoyed or irritable 1  Afraid - awful might happen 2  Total GAD 7 Score 15  Anxiety Difficulty Not difficult at all      Assessment/plan: 1. Annual physical exam Has had all of her lab work done with gyn and utd on shots/preventative health. Discussed diet/exercise. She will f/u in one year or as needed for this.  Patient counseling [x]    Nutrition: Stressed importance of moderation in sodium/caffeine intake, saturated fat and cholesterol, caloric balance, sufficient intake of fresh fruits, vegetables, fiber, calcium, iron, and 1 mg of folate supplement per day (for females capable of pregnancy).  [x]    Stressed the importance of regular exercise.   []    Substance Abuse: Discussed cessation/primary prevention of tobacco, alcohol, or other drug use; driving or other dangerous activities under the influence; availability of treatment for abuse.   [x]    Injury prevention: Discussed safety belts, safety helmets, smoke detector, smoking near bedding or upholstery.   [x]    Sexuality: Discussed sexually transmitted diseases, partner selection, use of condoms, avoidance of unintended pregnancy  and contraceptive alternatives.  [x]    Dental health: Discussed importance of regular tooth brushing, flossing, and dental visits.  [x]    Health maintenance and immunizations reviewed. Please refer to Health maintenance section.     2. Essential hypertension Multiple elevated readings. Encouraged diet (dash or  mediterranean), exercise and weight loss. WE are going to start her on medication at hctz 12.5mg  and have her follow up on Friday for a BP check with nurse and log. Will increase at that time if we need too. Also getting baseline EKG and urine up/uc. All other labs done by gyn and wnl. See her back in 1 month for f/u.  - EKG 12-Lead - Microalbumin / creatinine urine ratio  3. Situational anxiety GAD7 score is elevated, but all of this is new since she was told she had high blood pressure. Discussed with her I truly think this is situational. Would like to get her through her surgery, treat her blood pressure and then see how she is feeling. We will do hydroxyzine in the meantime to give her a more as needed medication as her anxiety tends to be worse at night. F/u in one month or sooner if needed.   Return in about 4 days (around 03/14/2018) for nurse BP check .     Orma Flaming, MD [facility]  03/10/2018

## 2018-03-10 NOTE — Telephone Encounter (Signed)
Call placed to patient to advised FMLA forms have ben completed and signed by Dr Quincy Simmonds. Left a voice mail message requesting a return call, to verify where forms should be sent.

## 2018-03-10 NOTE — Patient Instructions (Signed)
Anxiety: I think you have situational anxiety due to your surgery and HTN. We will treat your blood pressure and get you through the surgery and see how you feel. Sending in hydroxyzine for you to take as needed for anxiety. May make you drowsy so if you need to take 1/2 a pill you can.    For HTN. Starting hctz, a diuretic to help blood pressure. Will see you on Friday for BP check by nurse. You will take this once a day. Keep a blood pressure log and bring with you on Friday.

## 2018-03-11 NOTE — Telephone Encounter (Signed)
Patient returned call on 03/10/18 and confirmed to fax FMLA to the fax number listed on the forms. Forms faxed on 03/10/18, fax confirmation indicates the fax transmittal was successful. Will close encounter

## 2018-03-14 ENCOUNTER — Ambulatory Visit (INDEPENDENT_AMBULATORY_CARE_PROVIDER_SITE_OTHER): Payer: BLUE CROSS/BLUE SHIELD

## 2018-03-14 ENCOUNTER — Telehealth: Payer: Self-pay | Admitting: Family Medicine

## 2018-03-14 ENCOUNTER — Other Ambulatory Visit: Payer: Self-pay | Admitting: Family Medicine

## 2018-03-14 VITALS — BP 158/104 | HR 122

## 2018-03-14 DIAGNOSIS — I10 Essential (primary) hypertension: Secondary | ICD-10-CM

## 2018-03-14 MED ORDER — HYDROCHLOROTHIAZIDE 12.5 MG PO CAPS: 25.0000 mg | ORAL_CAPSULE | Freq: Every day | ORAL | 0 refills | Status: DC

## 2018-03-14 NOTE — Progress Notes (Signed)
Pt here for bp check.  Was seen on 11/11 for initial visit and bp was quite high then-170/84.  Pt due to have surgery very soon and is very nervous and stressed out due to this.  She also drives more than 1 hour to get here from [place], [place].    Today in office her bp is 178/98, HR 99.  She denies any SOB, chest pain, diaphoresis, pain in arm/shoulder, jaw pain and states that she feels completely fine.  Advised we will repeat bp after 10 mins of sitting and relaxing.  Reviewed this with Dr. Rogers Blocker and she  will start pt on meds today.  BP on repeat was 158/104, HR 122.  Per Dr. Rogers Blocker, HCTZ will be increased to 25 mg qd.

## 2018-03-14 NOTE — Telephone Encounter (Signed)
See note

## 2018-03-14 NOTE — Telephone Encounter (Signed)
Copied from [place] [phone]. Topic: Quick Communication - See Telephone Encounter >> Mar 14, 2018  3:54 PM Blase Mess A wrote: CRM for notification. See Telephone encounter for: 03/14/18.  Patient is calling to ask  Dr. Rogers Blocker should she take the medication that Dr. Rogers Blocker gave her for anxiety to relax her to see if it would help with her Blood pressure. Patient has not started the medication.  Should she double up on the anxiety medication instead of taking the bp medication? Please advise [phone]

## 2018-03-14 NOTE — Progress Notes (Signed)
She is here for BP check after we started her on hctz 12.5mg . Her bp was 154/108 on repeat. She states she drove here and likely why high. Im increasing her to 25mg  hctz/daily and asking her to keep a log. Will call me Monday for f/u. She has surgery on Tuesday.

## 2018-03-17 ENCOUNTER — Telehealth: Payer: Self-pay | Admitting: Obstetrics and Gynecology

## 2018-03-17 NOTE — Telephone Encounter (Signed)
Called and spoke with patient and advised per Dr. Rogers Blocker that she definitely needs to take her bp medication (pt states that she did begin taking the HCTZ 25 mg in the a.m.  Her home readings for her blood pressure for Saturday 11/16 am were 141/81 and she states that she wasn't able to monitor it on Sunday 11/17 but did take again this morning 11/18 and her reading was 134/90.  Advised I would review these readings with Dr. Rogers Blocker and contact her back with additional info if necessary.  She verbalized understanding.

## 2018-03-17 NOTE — H&P (Signed)
Office Visit   02/28/2018 [facility]    Yisroel Ramming, Everardo All, MD  Obstetrics and Gynecology   Fibroids  Dx   Surgical Consult    Reason for Visit   Additional Documentation   Vitals:   BP 140/82 (BP Location: Right Arm, Patient Position: Sitting, Cuff Size: Large)   Pulse 104    Resp 20   Ht 5\' 2"  (1.575 m)   Wt 96.3 kg   LMP 02/26/2018 (Exact Date)   BMI 38.85 kg/m   BSA 2.05 m   Flowsheets:   MEWS Score,   Anthropometrics     Encounter Info:   Billing Info,   History,   Allergies,   Detailed Report     All Notes   Progress Notes by Nunzio Cobbs, MD at 02/28/2018 1:00 PM  Author: Nunzio Cobbs, MD Author Type: Physician Filed: 03/03/2018 6:56 PM  Note Status: Signed Cosign: Cosign Not Required Encounter Date: 02/28/2018  Editor: Nunzio Cobbs, MD (Physician)  Prior Versions: 1. Lowella Fairy, CMA (Certified Medical Assistant) at 02/28/2018 1:21 PM - Sign at close encounter    GYNECOLOGY  VISIT   HPI: 47 y.o.   Married  [place] American  female   [phone] with Patient's last menstrual period was 02/26/2018 (exact date).   here for surgical consult.  Husband present for the entire visit today.  Using Depo Provera for control of fibroids and bleeding.  Still has some prolonged bleeding.   Has 4 fibroids ranging from 1.76 to 5.72 cm.  Sonohysterogram showed multiple filling defects which looks like scalloping along of the endometrial cavity.  EMB showed degenerating endometrium.   Has gained 30 - 40 pounds on Depo Provera.  Declines future childbearing.   States her PCP is following her blood pressure closely and is determining if she needs anti HTN or not.  Desires hysterectomy.   GYNECOLOGIC HISTORY: Patient's last menstrual period was 02/26/2018 (exact date). Contraception: Vasectomy Menopausal hormone therapy:  none Last mammogram: 12-07-16 Neg/BiRads1--Appt.03-03-18 Last pap smear:  01-13-18 Neg:Neg HR HPV                OB History    Gravida  3   Para  2   Term  2   Preterm  0   AB  1   Living  2     SAB  0   TAB  0   Ectopic  0   Multiple  0   Live Births  2                  Patient Active Problem List   Diagnosis Date Noted  . Fibroids 11/21/2016        Past Medical History:  Diagnosis Date  . Abnormal uterine bleeding   . Anemia   . Anxiety   . Fibroid   . Superficial thrombophlebitis 2005   One episode.  Treated with ASA.         Past Surgical History:  Procedure Laterality Date  . CESAREAN SECTION    . HERNIA REPAIR     supraumbilical herniorrhaphy after birth of second child.  [facility] [place].   No mesh per patient.  . OOPHORECTOMY Left           Current Outpatient Medications  Medication Sig Dispense Refill  . Ferrous Sulfate (SLOW FE) 142 (45 Fe) MG TBCR Take 142 mg by mouth daily.    Marland Kitchen  medroxyPROGESTERone (DEPO-PROVERA) 150 MG/ML injection Inject 150 mg into the muscle every 3 (three) months.     No current facility-administered medications for this visit.      ALLERGIES: Patient has no known allergies.       Family History  Problem Relation Age of Onset  . Hypertension Mother   . Hyperlipidemia Mother   . Hypertension Father   . Hyperlipidemia Father   . Congestive Heart Failure Father     Social History        Socioeconomic History  . Marital status: Married    Spouse name: Not on file  . Number of children: Not on file  . Years of education: Not on file  . Highest education level: Not on file  Occupational History  . Not on file  Social Needs  . Financial resource strain: Not on file  . Food insecurity:    Worry: Not on file    Inability: Not on file  . Transportation needs:    Medical: Not on file    Non-medical: Not on file  Tobacco Use  . Smoking status: Never Smoker  . Smokeless tobacco: Never Used  Substance and Sexual  Activity  . Alcohol use: No  . Drug use: No  . Sexual activity: Yes    Partners: Male    Birth control/protection: Other-see comments, Injection    Comment: husband with vasectomy,depo  Lifestyle  . Physical activity:    Days per week: Not on file    Minutes per session: Not on file  . Stress: Not on file  Relationships  . Social connections:    Talks on phone: Not on file    Gets together: Not on file    Attends religious service: Not on file    Active member of club or organization: Not on file    Attends meetings of clubs or organizations: Not on file    Relationship status: Not on file  . Intimate partner violence:    Fear of current or ex partner: Not on file    Emotionally abused: Not on file    Physically abused: Not on file    Forced sexual activity: Not on file  Other Topics Concern  . Not on file  Social History Narrative  . Not on file    Review of Systems  All other systems reviewed and are negative.   PHYSICAL EXAMINATION:    BP 140/82 (BP Location: Right Arm, Patient Position: Sitting, Cuff Size: Large)   Pulse (!) 104   Resp 20   Ht 5\' 2"  (1.575 m)   Wt 212 lb 6.4 oz (96.3 kg)   LMP 02/26/2018 (Exact Date)   BMI 38.85 kg/m     General appearance: alert, cooperative and appears stated age Head: Normocephalic, without obvious abnormality, atraumatic Neck: no adenopathy, supple, symmetrical, trachea midline and thyroid normal to inspection and palpation Lungs: clear to auscultation bilaterally Heart: regular rate and rhythm Abdomen: soft, non-tender, no masses,  no organomegaly Extremities: extremities normal, atraumatic, no cyanosis or edema Skin: Skin color, texture, turgor normal. No rashes or lesions Lymph nodes: Cervical, supraclavicular, and axillary nodes normal. No abnormal inguinal nodes palpated Neurologic: Grossly normal  Pelvic: External genitalia:  no lesions              Urethra:  normal appearing  urethra with no masses, tenderness or lesions              Bartholins and Skenes: normal  Vagina: normal appearing vagina with normal color and discharge, no lesions              Cervix: no lesions                Bimanual Exam:  Uterus: 14 week size, non-tender              Adnexa: no mass, fullness, tenderness          Chaperone was present for exam.  ASSESSMENT  Fibroids.  On Depo Provera.  Status post left oophorectomy.  Hx chronic endometritis.  Hx superficial thrombophlebitis.  Supraumbilical herniorrhaphy. Vasectomy for contraception.  Elevated blood pressure a prior office visit.   PLAN  I discussed total laparoscopic hysterectomy with bilateral salpingectomy and possible right oophorectomy, cystoscopy, vaginal morcellation of uterine fibroids.   I reviewed  Risks, benefits, and alternatives.   Risks include but are not limited to bleeding, infection, damage to surrounding organs, pneumonia, reaction to anesthesia, DVT, PE, death, need for reoperation, hernia formation, vaginal cuff dehiscence, need to convert to a traditional laparotomy incision to complete the procedure.   Surgical procedure and expectations reviewed.  I did discuss left upper quadrant trocar placement due to possible infraumbilical adhesions.  Will plan for vaginal morcellation of fibroids in [place] bag as needed.  Patient wishes to proceed.  She will do a magnesium citrate bowel prep.  She will receive Lovenox for DVT prophylaxis.    An After Visit Summary was printed and given to the patient.  ___25___ minutes face to face time of which over 50% was spent in counseling.

## 2018-03-17 NOTE — Telephone Encounter (Signed)
Patient called stating that she drank the drink that she was told, but is wondering what else she can have.

## 2018-03-17 NOTE — Telephone Encounter (Signed)
Think this should have gone to you

## 2018-03-17 NOTE — Telephone Encounter (Signed)
Keep taking the hctz 25mg  daily. I will see her back in 2-3 weeks (after her surgery) and we can see how she is doing and check her potassium.

## 2018-03-17 NOTE — Anesthesia Preprocedure Evaluation (Addendum)
Anesthesia Evaluation  Patient identified by MRN, date of birth, ID band Patient awake    Reviewed: Allergy & Precautions, H&P , NPO status , Patient's Chart, lab work & pertinent test results, reviewed documented beta blocker date and time   Airway Mallampati: II  TM Distance: >3 FB Neck ROM: full    Dental no notable dental hx.    Pulmonary neg pulmonary ROS,    Pulmonary exam normal breath sounds clear to auscultation       Cardiovascular Exercise Tolerance: Good negative cardio ROS   Rhythm:regular Rate:Normal     Neuro/Psych negative neurological ROS  negative psych ROS   GI/Hepatic negative GI ROS, Neg liver ROS,   Endo/Other  negative endocrine ROS  Renal/GU negative Renal ROS  negative genitourinary   Musculoskeletal   Abdominal   Peds  Hematology negative hematology ROS (+) Blood dyscrasia, anemia ,   Anesthesia Other Findings   Reproductive/Obstetrics negative OB ROS                            Anesthesia Physical Anesthesia Plan  ASA: II  Anesthesia Plan: General   Post-op Pain Management:    Induction: Intravenous  PONV Risk Score and Plan: 3 and Ondansetron, Dexamethasone, Treatment may vary due to age or medical condition and Scopolamine patch - Pre-op  Airway Management Planned: Oral ETT  Additional Equipment:   Intra-op Plan:   Post-operative Plan: Extubation in OR  Informed Consent: I have reviewed the patients History and Physical, chart, labs and discussed the procedure including the risks, benefits and alternatives for the proposed anesthesia with the patient or authorized representative who has indicated his/her understanding and acceptance.   Dental Advisory Given  Plan Discussed with: CRNA, Anesthesiologist and Surgeon  Anesthesia Plan Comments: (  )       Anesthesia Quick Evaluation

## 2018-03-17 NOTE — Telephone Encounter (Signed)
Call to patient. Reviewed with patient to not have anything but clear liquids. Reviewed clear liquids as soda, broth, tea, black coffee, Gatorade, Jell-O, popsicles, and water. Has already had magnesium citrate. Advised patient not to eat or drink anything including water, gum and breath mints after midnight. Patient verbalized understanding and appreciative of phone call.   Routing to provider and will close encounter.

## 2018-03-17 NOTE — Telephone Encounter (Signed)
Please see note and advise  

## 2018-03-17 NOTE — Telephone Encounter (Signed)
Please let her know she needs to take her blood pressure medication. The anxiety medication is for as needed, but I think she needs to treat her blood pressure. Sorry!

## 2018-03-17 NOTE — Telephone Encounter (Signed)
Called patient and advised her to continue taking HCTZ 25 mg qd and we will follow up in 2-3 wks after patient's surgery; recheck potassium at that time.  Pt verbalized understanding.

## 2018-03-17 NOTE — Telephone Encounter (Signed)
Spoke with patient. Patient calling to confirm pre-op instructions.   Patient is scheduled for [facility] 03/18/18 Ate breakfast this morning at 10am. Drank bottle of Mag Citrate at 12:30pm today. No BM yet.   Advised I will review pre-op instructions and our office will return call, patient is agreeable.

## 2018-03-18 ENCOUNTER — Encounter (HOSPITAL_COMMUNITY)
Admission: AD | Disposition: A | Discharge status: Home / Self Care | Payer: Self-pay | Source: Ambulatory Visit | Attending: Obstetrics and Gynecology

## 2018-03-18 ENCOUNTER — Observation Stay (HOSPITAL_COMMUNITY): Payer: BLUE CROSS/BLUE SHIELD | Admitting: Anesthesiology

## 2018-03-18 ENCOUNTER — Encounter (HOSPITAL_COMMUNITY): Payer: Self-pay | Admitting: Emergency Medicine

## 2018-03-18 ENCOUNTER — Other Ambulatory Visit: Payer: Self-pay

## 2018-03-18 ENCOUNTER — Observation Stay (HOSPITAL_COMMUNITY)
Admission: AD | Admit: 2018-03-18 | Discharge: 2018-03-19 | Disposition: A | Discharge status: Home / Self Care | Payer: BLUE CROSS/BLUE SHIELD | Source: Ambulatory Visit | Attending: Obstetrics and Gynecology | Admitting: Obstetrics and Gynecology

## 2018-03-18 DIAGNOSIS — K660 Peritoneal adhesions (postprocedural) (postinfection): Secondary | ICD-10-CM | POA: Diagnosis not present

## 2018-03-18 DIAGNOSIS — D259 Leiomyoma of uterus, unspecified: Principal | ICD-10-CM | POA: Insufficient documentation

## 2018-03-18 DIAGNOSIS — Z8249 Family history of ischemic heart disease and other diseases of the circulatory system: Secondary | ICD-10-CM | POA: Insufficient documentation

## 2018-03-18 DIAGNOSIS — N736 Female pelvic peritoneal adhesions (postinfective): Secondary | ICD-10-CM | POA: Diagnosis not present

## 2018-03-18 DIAGNOSIS — Z79899 Other long term (current) drug therapy: Secondary | ICD-10-CM | POA: Diagnosis not present

## 2018-03-18 DIAGNOSIS — D250 Submucous leiomyoma of uterus: Secondary | ICD-10-CM | POA: Diagnosis not present

## 2018-03-18 DIAGNOSIS — N800 Endometriosis of uterus: Secondary | ICD-10-CM | POA: Insufficient documentation

## 2018-03-18 DIAGNOSIS — Z791 Long term (current) use of non-steroidal anti-inflammatories (NSAID): Secondary | ICD-10-CM | POA: Diagnosis not present

## 2018-03-18 DIAGNOSIS — Z793 Long term (current) use of hormonal contraceptives: Secondary | ICD-10-CM | POA: Insufficient documentation

## 2018-03-18 DIAGNOSIS — N939 Abnormal uterine and vaginal bleeding, unspecified: Secondary | ICD-10-CM | POA: Diagnosis not present

## 2018-03-18 DIAGNOSIS — D649 Anemia, unspecified: Secondary | ICD-10-CM | POA: Diagnosis not present

## 2018-03-18 DIAGNOSIS — D219 Benign neoplasm of connective and other soft tissue, unspecified: Secondary | ICD-10-CM | POA: Diagnosis present

## 2018-03-18 DIAGNOSIS — N9971 Accidental puncture and laceration of a genitourinary system organ or structure during a genitourinary system procedure: Secondary | ICD-10-CM | POA: Diagnosis not present

## 2018-03-18 DIAGNOSIS — F419 Anxiety disorder, unspecified: Secondary | ICD-10-CM | POA: Insufficient documentation

## 2018-03-18 DIAGNOSIS — R030 Elevated blood-pressure reading, without diagnosis of hypertension: Secondary | ICD-10-CM | POA: Diagnosis not present

## 2018-03-18 DIAGNOSIS — Z90710 Acquired absence of both cervix and uterus: Secondary | ICD-10-CM | POA: Diagnosis present

## 2018-03-18 HISTORY — PX: LAPAROSCOPIC LYSIS OF ADHESIONS: SHX5905

## 2018-03-18 HISTORY — PX: TOTAL LAPAROSCOPIC HYSTERECTOMY WITH SALPINGECTOMY: SHX6742

## 2018-03-18 HISTORY — PX: PERINEAL LACERATION REPAIR: SHX5389

## 2018-03-18 HISTORY — PX: CYSTOSCOPY: SHX5120

## 2018-03-18 LAB — PREGNANCY, URINE: Preg Test, Ur: NEGATIVE

## 2018-03-18 LAB — CBC
HCT: 37.4 % (ref 36.0–46.0)
Hemoglobin: 12.9 g/dL (ref 12.0–15.0)
MCH: 31.3 pg (ref 26.0–34.0)
MCHC: 34.5 g/dL (ref 30.0–36.0)
MCV: 90.8 fL (ref 80.0–100.0)
Platelets: 307 10*3/uL (ref 150–400)
RBC: 4.12 MIL/uL (ref 3.87–5.11)
RDW: 12.5 % (ref 11.5–15.5)
WBC: 17.5 10*3/uL — ABNORMAL HIGH (ref 4.0–10.5)
nRBC: 0 % (ref 0.0–0.2)

## 2018-03-18 LAB — CREATININE, SERUM: CREATININE: 0.88 mg/dL (ref 0.44–1.00)

## 2018-03-18 SURGERY — HYSTERECTOMY, TOTAL, LAPAROSCOPIC, WITH SALPINGECTOMY
Anesthesia: General

## 2018-03-18 MED ORDER — ACETAMINOPHEN 325 MG PO TABS: 325.0000 mg | ORAL_TABLET | ORAL | Status: DC | PRN

## 2018-03-18 MED ORDER — OXYCODONE HCL 5 MG PO TABS
ORAL_TABLET | ORAL | Status: AC
  Filled 2018-03-18: qty 1

## 2018-03-18 MED ORDER — MIDAZOLAM HCL 2 MG/2ML IJ SOLN
INTRAMUSCULAR | Status: DC | PRN
  Administered 2018-03-18: 2 mg via INTRAVENOUS

## 2018-03-18 MED ORDER — STERILE WATER FOR IRRIGATION IR SOLN
Status: DC | PRN
  Administered 2018-03-18: 1000 mL via INTRAVESICAL

## 2018-03-18 MED ORDER — BUPIVACAINE HCL (PF) 0.25 % IJ SOLN
INTRAMUSCULAR | Status: DC | PRN
  Administered 2018-03-18: 19 mL

## 2018-03-18 MED ORDER — LACTATED RINGERS IV SOLN: INTRAVENOUS | Status: DC

## 2018-03-18 MED ORDER — SODIUM CHLORIDE 0.9 % IV SOLN
INTRAVENOUS | Status: DC | PRN
  Administered 2018-03-18: 60 mL

## 2018-03-18 MED ORDER — KETOROLAC TROMETHAMINE 30 MG/ML IJ SOLN
INTRAMUSCULAR | Status: DC | PRN
  Administered 2018-03-18: 30 mg via INTRAVENOUS

## 2018-03-18 MED ORDER — KETOROLAC TROMETHAMINE 30 MG/ML IJ SOLN
INTRAMUSCULAR | Status: AC
  Filled 2018-03-18: qty 1

## 2018-03-18 MED ORDER — ACETAMINOPHEN 160 MG/5ML PO SOLN: 325.0000 mg | ORAL | Status: DC | PRN

## 2018-03-18 MED ORDER — LABETALOL HCL 5 MG/ML IV SOLN
INTRAVENOUS | Status: AC
  Filled 2018-03-18: qty 4

## 2018-03-18 MED ORDER — PROPOFOL 10 MG/ML IV BOLUS
INTRAVENOUS | Status: AC
  Filled 2018-03-18: qty 20

## 2018-03-18 MED ORDER — ONDANSETRON HCL 4 MG/2ML IJ SOLN
INTRAMUSCULAR | Status: DC | PRN
  Administered 2018-03-18 (×2): 2 mg via INTRAVENOUS

## 2018-03-18 MED ORDER — ENOXAPARIN SODIUM 40 MG/0.4ML ~~LOC~~ SOLN
40.0000 mg | SUBCUTANEOUS | Status: AC
  Administered 2018-03-18: 40 mg via SUBCUTANEOUS

## 2018-03-18 MED ORDER — SUFENTANIL CITRATE 50 MCG/ML IV SOLN
INTRAVENOUS | Status: DC | PRN
  Administered 2018-03-18 (×3): 10 ug via INTRAVENOUS

## 2018-03-18 MED ORDER — ROCURONIUM BROMIDE 100 MG/10ML IV SOLN
INTRAVENOUS | Status: AC
  Filled 2018-03-18: qty 1

## 2018-03-18 MED ORDER — SODIUM CHLORIDE 0.9 % IR SOLN
Status: DC | PRN
  Administered 2018-03-18: 3000 mL

## 2018-03-18 MED ORDER — HYDROXYZINE HCL 25 MG PO TABS
25.0000 mg | ORAL_TABLET | Freq: Three times a day (TID) | ORAL | Status: DC | PRN
  Filled 2018-03-18: qty 1

## 2018-03-18 MED ORDER — HYDROMORPHONE HCL 1 MG/ML IJ SOLN
INTRAMUSCULAR | Status: DC | PRN
  Administered 2018-03-18: 1 mg via INTRAVENOUS

## 2018-03-18 MED ORDER — SCOPOLAMINE 1 MG/3DAYS TD PT72
1.0000 | MEDICATED_PATCH | Freq: Once | TRANSDERMAL | Status: DC
  Administered 2018-03-18: 1.5 mg via TRANSDERMAL

## 2018-03-18 MED ORDER — MENTHOL 3 MG MT LOZG
1.0000 | LOZENGE | OROMUCOSAL | Status: DC | PRN
  Filled 2018-03-18: qty 9

## 2018-03-18 MED ORDER — LABETALOL HCL 5 MG/ML IV SOLN
INTRAVENOUS | Status: DC | PRN
  Administered 2018-03-18: 5 mg via INTRAVENOUS

## 2018-03-18 MED ORDER — SUFENTANIL CITRATE 50 MCG/ML IV SOLN
INTRAVENOUS | Status: AC
  Filled 2018-03-18: qty 1

## 2018-03-18 MED ORDER — SODIUM CHLORIDE 0.9 % IV SOLN
2.0000 g | INTRAVENOUS | Status: AC
  Administered 2018-03-18: 2 g via INTRAVENOUS

## 2018-03-18 MED ORDER — ONDANSETRON HCL 4 MG/2ML IJ SOLN
INTRAMUSCULAR | Status: AC
  Filled 2018-03-18: qty 2

## 2018-03-18 MED ORDER — HYDROMORPHONE HCL 1 MG/ML IJ SOLN
INTRAMUSCULAR | Status: AC
  Filled 2018-03-18: qty 1

## 2018-03-18 MED ORDER — KETOROLAC TROMETHAMINE 30 MG/ML IJ SOLN
30.0000 mg | Freq: Four times a day (QID) | INTRAMUSCULAR | Status: DC
  Administered 2018-03-18 – 2018-03-19 (×4): 30 mg via INTRAVENOUS
  Filled 2018-03-18 (×4): qty 1

## 2018-03-18 MED ORDER — PROPOFOL 10 MG/ML IV BOLUS
INTRAVENOUS | Status: DC | PRN
  Administered 2018-03-18: 150 mg via INTRAVENOUS

## 2018-03-18 MED ORDER — SUGAMMADEX SODIUM 200 MG/2ML IV SOLN
INTRAVENOUS | Status: DC | PRN
  Administered 2018-03-18: 200 mg via INTRAVENOUS

## 2018-03-18 MED ORDER — ROCURONIUM BROMIDE 100 MG/10ML IV SOLN
INTRAVENOUS | Status: DC | PRN
  Administered 2018-03-18: 10 mg via INTRAVENOUS
  Administered 2018-03-18: 60 mg via INTRAVENOUS

## 2018-03-18 MED ORDER — LACTATED RINGERS IV SOLN
INTRAVENOUS | Status: DC
  Administered 2018-03-18 – 2018-03-19 (×3): via INTRAVENOUS

## 2018-03-18 MED ORDER — BUPIVACAINE HCL (PF) 0.25 % IJ SOLN
INTRAMUSCULAR | Status: AC
  Filled 2018-03-18: qty 30

## 2018-03-18 MED ORDER — ONDANSETRON HCL 4 MG/2ML IJ SOLN: 4.0000 mg | Freq: Four times a day (QID) | INTRAMUSCULAR | Status: DC | PRN

## 2018-03-18 MED ORDER — SODIUM CHLORIDE (PF) 0.9 % IJ SOLN
INTRAMUSCULAR | Status: AC
  Filled 2018-03-18: qty 50

## 2018-03-18 MED ORDER — VASOPRESSIN 20 UNIT/ML IV SOLN
INTRAVENOUS | Status: AC
  Filled 2018-03-18: qty 1

## 2018-03-18 MED ORDER — OXYCODONE-ACETAMINOPHEN 5-325 MG PO TABS
1.0000 | ORAL_TABLET | ORAL | Status: DC | PRN
  Administered 2018-03-18: 1 via ORAL
  Administered 2018-03-19: 2 via ORAL
  Filled 2018-03-18: qty 2
  Filled 2018-03-18: qty 1

## 2018-03-18 MED ORDER — GLYCOPYRROLATE 0.2 MG/ML IJ SOLN
INTRAMUSCULAR | Status: AC
  Filled 2018-03-18: qty 1

## 2018-03-18 MED ORDER — FENTANYL CITRATE (PF) 100 MCG/2ML IJ SOLN: 25.0000 ug | INTRAMUSCULAR | Status: DC | PRN

## 2018-03-18 MED ORDER — SODIUM CHLORIDE 0.9 % IV SOLN
INTRAVENOUS | Status: AC
  Filled 2018-03-18: qty 2

## 2018-03-18 MED ORDER — MORPHINE SULFATE (PF) 4 MG/ML IV SOLN: 2.0000 mg | INTRAVENOUS | Status: DC | PRN

## 2018-03-18 MED ORDER — LIDOCAINE HCL (CARDIAC) PF 100 MG/5ML IV SOSY
PREFILLED_SYRINGE | INTRAVENOUS | Status: DC | PRN
  Administered 2018-03-18: 80 mg via INTRAVENOUS

## 2018-03-18 MED ORDER — LACTATED RINGERS IV BOLUS
500.0000 mL | Freq: Once | INTRAVENOUS | Status: AC
  Administered 2018-03-18: 500 mL via INTRAVENOUS

## 2018-03-18 MED ORDER — DEXAMETHASONE SODIUM PHOSPHATE 10 MG/ML IJ SOLN
INTRAMUSCULAR | Status: AC
  Filled 2018-03-18: qty 1

## 2018-03-18 MED ORDER — 0.9 % SODIUM CHLORIDE (POUR BTL) OPTIME
TOPICAL | Status: DC | PRN
  Administered 2018-03-18: 1000 mL

## 2018-03-18 MED ORDER — SODIUM CHLORIDE (PF) 0.9 % IJ SOLN
INTRAMUSCULAR | Status: AC
  Filled 2018-03-18: qty 10

## 2018-03-18 MED ORDER — SODIUM CHLORIDE 0.9% FLUSH
INTRAVENOUS | Status: DC | PRN
  Administered 2018-03-18: 10 mL

## 2018-03-18 MED ORDER — PHENYLEPHRINE HCL 10 MG/ML IJ SOLN
INTRAMUSCULAR | Status: DC | PRN
  Administered 2018-03-18 (×3): .08 mg via INTRAVENOUS

## 2018-03-18 MED ORDER — OXYCODONE HCL 5 MG PO TABS
5.0000 mg | ORAL_TABLET | Freq: Once | ORAL | Status: AC | PRN
  Administered 2018-03-18: 5 mg via ORAL

## 2018-03-18 MED ORDER — ONDANSETRON HCL 4 MG PO TABS: 4.0000 mg | ORAL_TABLET | Freq: Four times a day (QID) | ORAL | Status: DC | PRN

## 2018-03-18 MED ORDER — MEPERIDINE HCL 25 MG/ML IJ SOLN: 6.2500 mg | INTRAMUSCULAR | Status: DC | PRN

## 2018-03-18 MED ORDER — IBUPROFEN 800 MG PO TABS: 800.0000 mg | ORAL_TABLET | Freq: Three times a day (TID) | ORAL | Status: DC | PRN

## 2018-03-18 MED ORDER — PHENYLEPHRINE 40 MCG/ML (10ML) SYRINGE FOR IV PUSH (FOR BLOOD PRESSURE SUPPORT)
PREFILLED_SYRINGE | INTRAVENOUS | Status: AC
  Filled 2018-03-18: qty 10

## 2018-03-18 MED ORDER — ENOXAPARIN SODIUM 40 MG/0.4ML ~~LOC~~ SOLN
SUBCUTANEOUS | Status: AC
  Administered 2018-03-18: 40 mg via SUBCUTANEOUS
  Filled 2018-03-18: qty 0.4

## 2018-03-18 MED ORDER — ROPIVACAINE HCL 5 MG/ML IJ SOLN
INTRAMUSCULAR | Status: AC
  Filled 2018-03-18: qty 30

## 2018-03-18 MED ORDER — MIDAZOLAM HCL 2 MG/2ML IJ SOLN
INTRAMUSCULAR | Status: AC
  Filled 2018-03-18: qty 2

## 2018-03-18 MED ORDER — LACTATED RINGERS IV SOLN
INTRAVENOUS | Status: DC
  Administered 2018-03-18 (×4): via INTRAVENOUS

## 2018-03-18 MED ORDER — GLYCOPYRROLATE 0.2 MG/ML IJ SOLN
INTRAMUSCULAR | Status: DC | PRN
  Administered 2018-03-18 (×2): 0.1 mg via INTRAVENOUS

## 2018-03-18 MED ORDER — SODIUM CHLORIDE (PF) 0.9 % IJ SOLN
INTRAMUSCULAR | Status: AC
  Filled 2018-03-18: qty 100

## 2018-03-18 MED ORDER — ONDANSETRON HCL 4 MG/2ML IJ SOLN: 4.0000 mg | Freq: Once | INTRAMUSCULAR | Status: DC | PRN

## 2018-03-18 MED ORDER — DEXAMETHASONE SODIUM PHOSPHATE 10 MG/ML IJ SOLN
INTRAMUSCULAR | Status: DC | PRN
  Administered 2018-03-18: 10 mg via INTRAVENOUS

## 2018-03-18 MED ORDER — SUGAMMADEX SODIUM 200 MG/2ML IV SOLN
INTRAVENOUS | Status: AC
  Filled 2018-03-18: qty 2

## 2018-03-18 MED ORDER — OXYCODONE HCL 5 MG/5ML PO SOLN: 5.0000 mg | Freq: Once | ORAL | Status: AC | PRN

## 2018-03-18 MED ORDER — LIDOCAINE HCL (CARDIAC) PF 100 MG/5ML IV SOSY
PREFILLED_SYRINGE | INTRAVENOUS | Status: AC
  Filled 2018-03-18: qty 5

## 2018-03-18 MED ORDER — SCOPOLAMINE 1 MG/3DAYS TD PT72
MEDICATED_PATCH | TRANSDERMAL | Status: AC
  Administered 2018-03-18: 1.5 mg via TRANSDERMAL
  Filled 2018-03-18: qty 1

## 2018-03-18 MED ORDER — ENOXAPARIN SODIUM 40 MG/0.4ML ~~LOC~~ SOLN
40.0000 mg | SUBCUTANEOUS | Status: DC
  Administered 2018-03-19: 40 mg via SUBCUTANEOUS
  Filled 2018-03-18 (×2): qty 0.4

## 2018-03-18 SURGICAL SUPPLY — 54 items
APPLICATOR ARISTA FLEXITIP XL (MISCELLANEOUS) ×4 IMPLANT
BARRIER ADHS 3X4 INTERCEED (GAUZE/BANDAGES/DRESSINGS) IMPLANT
BLADE SURG 10 STRL SS (BLADE) ×12 IMPLANT
CABLE HIGH FREQUENCY MONO STRZ (ELECTRODE) IMPLANT
CANISTER SUCT 3000ML PPV (MISCELLANEOUS) ×4 IMPLANT
CELL SAVER LIPIGURD (MISCELLANEOUS) IMPLANT
COVER BACK TABLE 60X90IN (DRAPES) ×4 IMPLANT
COVER MAYO STAND STRL (DRAPES) ×4 IMPLANT
DECANTER SPIKE VIAL GLASS SM (MISCELLANEOUS) ×8 IMPLANT
DERMABOND ADVANCED (GAUZE/BANDAGES/DRESSINGS) ×2
DERMABOND ADVANCED .7 DNX12 (GAUZE/BANDAGES/DRESSINGS) ×2 IMPLANT
DURAPREP 26ML APPLICATOR (WOUND CARE) ×4 IMPLANT
EXTRT SYSTEM ALEXIS 14CM (MISCELLANEOUS)
EXTRT SYSTEM ALEXIS 17CM (MISCELLANEOUS) ×4
GLOVE BIO SURGEON STRL SZ 6.5 (GLOVE) ×6 IMPLANT
GLOVE BIO SURGEONS STRL SZ 6.5 (GLOVE) ×2
GLOVE BIOGEL PI IND STRL 7.0 (GLOVE) ×6 IMPLANT
GLOVE BIOGEL PI INDICATOR 7.0 (GLOVE) ×6
GOWN STRL REUS W/TWL LRG LVL3 (GOWN DISPOSABLE) ×16 IMPLANT
HEMOSTAT ARISTA ABSORB 3G PWDR (MISCELLANEOUS) ×4 IMPLANT
LIGASURE VESSEL 5MM BLUNT TIP (ELECTROSURGICAL) ×4 IMPLANT
NEEDLE INSUFFLATION 120MM (ENDOMECHANICALS) ×4 IMPLANT
OCCLUDER COLPOPNEUMO (BALLOONS) ×4 IMPLANT
PACK LAPAROSCOPY BASIN (CUSTOM PROCEDURE TRAY) ×4 IMPLANT
PACK TRENDGUARD 450 HYBRID PRO (MISCELLANEOUS) ×2 IMPLANT
POUCH LAPAROSCOPIC INSTRUMENT (MISCELLANEOUS) ×4 IMPLANT
PROTECTOR NERVE ULNAR (MISCELLANEOUS) ×8 IMPLANT
SCISSORS LAP 5X35 DISP (ENDOMECHANICALS) IMPLANT
SET CYSTO W/LG BORE CLAMP LF (SET/KITS/TRAYS/PACK) ×4 IMPLANT
SET IRRIG TUBING LAPAROSCOPIC (IRRIGATION / IRRIGATOR) ×4 IMPLANT
SET TRI-LUMEN FLTR TB AIRSEAL (TUBING) ×4 IMPLANT
SLEEVE ADV FIXATION 5X100MM (TROCAR) ×4 IMPLANT
SUT VIC AB 0 CT1 27 (SUTURE) ×4
SUT VIC AB 0 CT1 27XBRD ANBCTR (SUTURE) ×4 IMPLANT
SUT VICRYL 0 UR6 27IN ABS (SUTURE) IMPLANT
SUT VICRYL 4-0 PS2 18IN ABS (SUTURE) ×4 IMPLANT
SUT VLOC 180 0 9IN  GS21 (SUTURE) ×2
SUT VLOC 180 0 9IN GS21 (SUTURE) ×2 IMPLANT
SYR 10ML LL (SYRINGE) ×4 IMPLANT
SYR 50ML LL SCALE MARK (SYRINGE) ×8 IMPLANT
SYSTEM CARTER THOMASON II (TROCAR) ×4 IMPLANT
SYSTEM CONTND EXTRCTN KII BLLN (MISCELLANEOUS) ×2 IMPLANT
TIP UTERINE 6.7X8CM BLUE DISP (MISCELLANEOUS) ×4 IMPLANT
TOWEL OR 17X24 6PK STRL BLUE (TOWEL DISPOSABLE) ×8 IMPLANT
TRAY FOLEY W/BAG SLVR 14FR (SET/KITS/TRAYS/PACK) ×4 IMPLANT
TRENDGUARD 450 HYBRID PRO PACK (MISCELLANEOUS) ×4
TROCAR ADV FIXATION 5X100MM (TROCAR) ×4 IMPLANT
TROCAR PORT AIRSEAL 8X120 (TROCAR) ×4 IMPLANT
TROCAR XCEL NON-BLD 5MMX100MML (ENDOMECHANICALS) ×4 IMPLANT
TUBING INSUF HEATED (TUBING) ×4 IMPLANT
TUBING NON-CON 1/4 X 20 CONN (TUBING) ×3 IMPLANT
TUBING NON-CON 1/4 X 20' CONN (TUBING) ×1
WARMER LAPAROSCOPE (MISCELLANEOUS) ×4 IMPLANT
YANKAUER SUCT BULB TIP NO VENT (SUCTIONS) ×4 IMPLANT

## 2018-03-18 NOTE — Progress Notes (Signed)
Update to History and Physical  No marked change in status since office preop visit other than starting HCTZ for HTN. Patient examined.  OK to proceed with surgery.

## 2018-03-18 NOTE — Anesthesia Postprocedure Evaluation (Signed)
Anesthesia Post Note  Patient: Melanie Hopkins  Procedure(s) Performed: TOTAL LAPAROSCOPIC HYSTERECTOMY WITH BILATERAL SALPINGECTOMY WITH VAGINAL MORCELLATION CYSTOSCOPY (N/A ) LAPAROSCOPIC LYSIS OF ADHESIONS REPAIR PERINEAL LACERATION     Patient location during evaluation: [facility] Anesthesia Type: General Level of consciousness: awake and alert Pain management: pain level controlled Vital Signs Assessment: post-procedure vital signs reviewed and stable Respiratory status: spontaneous breathing, nonlabored ventilation and respiratory function stable Cardiovascular status: stable Postop Assessment: no apparent nausea or vomiting and adequate PO intake Anesthetic complications: no    Last Vitals:  Vitals:   03/18/18 1421 03/18/18 1807  BP: 125/70 123/72  Pulse: 100 89  Resp: 18 18  Temp: 36.4 C 37.1 C  SpO2: 98% 99%    Last Pain:  Vitals:   03/18/18 1807  TempSrc: Oral  PainSc:    Pain Goal: Patients Stated Pain Goal: 2 (03/18/18 1800)               Ellory Khurana Hristova

## 2018-03-18 NOTE — Op Note (Signed)
OPERATIVE REPORT  PREOPERATIVE DIAGNOSIS:  Uterine fibroids   POSTOPERATIVE DIAGNOSIS:  Uterine fibroids  PROCEDURES:  Total laparoscopic hysterectomy with bilateral salpingectomy, lysis of adhesions, vaginal morcellation of the uterus and cervix in an Alexis bag, repair of perineal laceration, cystoscopy  SURGEON:  Lenard Galloway, M.D.  ASSISTANT:   Edwinna Areola, M.D.  ANESTHESIA:  General endotracheal, intraperitoneal ropivicaine 30 mL diluted in 30 mL of normal saline, local with 0.25% Marcaine.  IVF:  3000 cc LR  ESTIMATED BLOOD LOSS:   25 cc  URINE OUTPUT:   270 cc  COMPLICATIONS:  None.  INDICATIONS FOR THE PROCEDURE:     The patient presents with irregular vaginal bleeding, fibroids, and a history of anemia.  Pelvic ultrasound showed a multifibroid uterus and sonohysterogram showed irregular borders of her endometrium.  Endometrial biopsy revealed benign degenerating endometrium.  She was managed with Depo Provera and continued to have bleeding.   She declines further medical therapy and future childbearing, and is requesting hysterectomy procedure.   A plan is made to proceed with a total laparoscopic hysterectomy with bilateral salpingectomy, possible unilateral  oophorectomy, and cystoscopy after risks, benefits, and alternatives are reviewed.  FINDINGS:     Laparoscopy revealed a 16 week size multifibroid uterus,normal bilateral tubes and a normal left ovary.  The upper abdomen demonstrated superficial adhesions around the falciform ligament.  She had a normal liver.  There was minor adhesive adhesive disease of omentum under the umbilicus, and this was completely excised.  No endometriosis was seen in the abdomen or pelvis.    Cystoscopy at the termination of the procedure showed the bladder to be normal throughout 360 degrees including the bladder dome and trigone. There was no evidence of any foreign body in the bladder or the urethra. There was no evidence of any  lesions  of the bladder or the urethra.   Both of the ureters were noted to be patent bilaterally.  There was a small 1 cm perineal laceration from the weighted speculum, and this was repaired at the termination of the surgery.  SPECIMENS:     The uterus and cervix were went to pathology as a morcellated specimen. The bilateral fallopian tubes were sent with this same specimen.   DESCRIPTION OF PROCEDURE:    The patient was reidentified in the preoperative hold area.   She did receive Cefotetan IV for antibiotic prophylaxis.  She received Lovenox, TED hose and PAS stockings for DVT prophylaxis.  In the operating room, the patient was placed in the dorsal lithotomy position on the operating room table.  The Trendguard was used to support her on the OR table.  Her legs were placed in the [place] stirrups and her arms were both padded and tucked at her sides. The patient received general endotracheal anesthesia. The abdomen and vagina were then sterilely prepped and she was sterilely draped.  A speculum was placed in the vagina and a single-tooth tenaculum was placed on the anterior cervical lip.  A figure-of-eight suture of 0 Vicryl was placed on each the anterior and the posterior cervical lips. The uterus was sounded to _ 8___ cm.  The cervix was then dilated with Hegar dilators.  A #  __8____ RUMI tip with a small metal KOH ring was then placed through the cervix and into the uterine cavity without difficulty.  The remaining vaginal instruments were then removed.  A Foley catheter was placed inside the bladder.  Attention was turned to the abdomen where a 1  mm left upper quadrant trocar with Optiview was placed after the skin was injected with .25% Marcaine and an incision created with the scalpel.  The umbilical region was injected with 0.25% Marcaine and a small incision created.  A 5 mm camera port was then placed under direct visualization.  5 mm incisions were then created in the left upper  abdomen and the left lower abdomen after the skin was injected locally with 0.25% Marcaine.  The 5 mm trocars were then placed under visualization of the laparoscope.  An 8 mm Air Seal trocar was similarly placed in the right lower quadrant after injecting with Marcaine and incising with a scalpel.     Ropivicine was placed inside the peritoneal cavity.   The patient was placed in Trendelenburg position.  An inspection of the abdomen and pelvis was performed. The findings are as noted above.   The infraumbilical adhesions were lysed using the Harmonic scalpel.   The bilateral ureters were identified.  The left fallopian tube was grasped and the Ligasure was used to cauterize and cut through the mesosalpinx and then the proximal tube.  The specimen was sent to pathology.  The left utero-ovarian ligament was similarly cauterized and cut with the Ligasure.  The left round ligament was then cauterized and divided with same instrument.  Dissection was performed to the anterior and posterior leaves of the broad ligaments using the Harmonic scalpel.  The incision was carried across the anterior cul- de-sac along the vesicouterine fold and the bladder was dissected away from the cervix using the Harmonic scalpel. The peritoneum was taken down posteriorly.  The left uterine artery was skeletonized.   It was then cauterized and cut with the Ligasure instrument.  Attention was turned to the patient's right-hand side at this time.  The same procedure that was performed on the left side was repeated on the right side with respect to the isolation, cautery, and transection of the vessels and the bladder flap dissection.   The right fallopian tube was removed from the peritoneal cavity as well.  The KOH ring was nicely visible.  The colpotomy incision was performed with the Harmonic scalpel in a circumferential fashion. A laparoscopic tenaculum was needed to grasp the uterine fundus in order to have adequate  visualization of the cervix for completion of the colpotomy.   The uterus and cervix were separated from the Rumi device and the Alexis bag was placed in the the peritoneal cavity through the vagina. The uterus and cervix were placed inside the [place] bag, which was then brought out through the vagina.  A weighted speculum was placed in the vagina and Deaver retractors were used to provide exposure to the uterus and cervix.  Morcellation was performed 100% inside the bag using a scalpel and Mayo scissors.  The specimen was collected and was later sent to Pathology.   The vaginal occluder was placed inside the vagina. The vaginal cuff was sutured laparoscopically at this time using a running suture of 0 V-Loc.  The vagina was closed from the patient's right hand side to the left hand side and then back 2 sutures towards the midline.  This provided good full-thickness closure of the vaginal cuff.  The laparoscopic needle for suturing was removed from the peritoneal cavity.  The pelvis was irrigated and suctioned.   The pneumoperitoneal was let down.  There was good hemostasis of the operative sites and pedicles.  Carleene Overlie was placed in the pelvis over the operating sites.  The 8 mm trocar was removed and the [employer] instrument was used to close the fascia with a 0/0 vicryl suture.   The remaining left sided laparoscopic trocars were removed after the CO2 pneumoperitoneum was released and the patient received manual breaths.    The patient's Foley catheter was removed at this time and cystoscopy was performed and the findings are as noted above.  The Foley catheter was replaced after the cystoscopy was completed.   Final inspection of the vagina demonstrated good hemostasis of the vaginal cuff. The right perineum contained a 1 cm laceration where the weighted speculum had been.  This was repaired with a running locked suture of 2/0 Vicryl.   All skin incisions were closed with subcuticular  sutures of 4-0 Vicryl.  Dermabond was placed over the incisions.  This concluded the patient's procedure.  She was extubated and escorted to the recovery room in stable and awake condition.  There were no complications to the procedure.  All needle, instrument, and sponge counts were correct.  An MD assistant was necessary for tissue manipulation, management of laparoscopic and vaginal instrumentation, retraction and positioning due to the complexity of the case and hospital policies

## 2018-03-18 NOTE — Anesthesia Postprocedure Evaluation (Signed)
Anesthesia Post Note  Patient: Melanie Hopkins  Procedure(s) Performed: TOTAL LAPAROSCOPIC HYSTERECTOMY WITH BILATERAL SALPINGECTOMY WITH VAGINAL MORCELLATION CYSTOSCOPY (N/A ) LAPAROSCOPIC LYSIS OF ADHESIONS REPAIR PERINEAL LACERATION     Patient location during evaluation: PACU Anesthesia Type: General Level of consciousness: awake and alert Pain management: pain level controlled Vital Signs Assessment: post-procedure vital signs reviewed and stable Respiratory status: spontaneous breathing, nonlabored ventilation, respiratory function stable and patient connected to nasal cannula oxygen Cardiovascular status: blood pressure returned to baseline and stable Postop Assessment: no apparent nausea or vomiting Anesthetic complications: no    Last Vitals:  Vitals:   03/18/18 1300 03/18/18 1315  BP: 128/81 125/86  Pulse: 93 88  Resp: 16 18  Temp:  36.9 C  SpO2: 93% 100%    Last Pain:  Vitals:   03/18/18 1315  TempSrc: Oral  PainSc:    Pain Goal: Patients Stated Pain Goal: 3 (03/18/18 1315)               Morgin Halls

## 2018-03-18 NOTE — Addendum Note (Signed)
Addendum  created 03/18/18 1809 by Hewitt Blade, CRNA   Sign clinical note

## 2018-03-18 NOTE — Transfer of Care (Signed)
Immediate Anesthesia Transfer of Care Note  Patient: Melanie Hopkins  Procedure(s) Performed: TOTAL LAPAROSCOPIC HYSTERECTOMY WITH BILATERAL SALPINGECTOMY WITH VAGINAL MORCELLATION CYSTOSCOPY (N/A ) LAPAROSCOPIC LYSIS OF ADHESIONS REPAIR PERINEAL LACERATION  Patient Location: PACU  Anesthesia Type:General  Level of Consciousness: awake, alert  and oriented  Airway & Oxygen Therapy: Patient Spontanous Breathing and Patient connected to nasal cannula oxygen  Post-op Assessment: Report given to RN, Post -op Vital signs reviewed and stable and Patient moving all extremities X 4  Post vital signs: Reviewed and stable  Last Vitals:  Vitals Value Taken Time  BP 132/79 03/18/2018 11:00 AM  Temp    Pulse 107 03/18/2018 11:01 AM  Resp 16 03/18/2018 11:01 AM  SpO2 100 % 03/18/2018 11:01 AM  Vitals shown include unvalidated device data.  Last Pain:  Vitals:   03/18/18 5809  TempSrc: Oral      Patients Stated Pain Goal: 3 (98/33/82 5053)  Complications: No apparent anesthesia complications

## 2018-03-18 NOTE — Anesthesia Procedure Notes (Signed)
Procedure Name: Intubation Date/Time: 03/18/2018 7:36 AM Performed by: Janeece Riggers, MD Pre-anesthesia Checklist: Patient identified, Patient being monitored, Timeout performed, Emergency Drugs available and Suction available Patient Re-evaluated:Patient Re-evaluated prior to induction Oxygen Delivery Method: Circle System Utilized Preoxygenation: Pre-oxygenation with 100% oxygen Induction Type: IV induction Ventilation: Mask ventilation without difficulty Laryngoscope Size: Miller and 2 Grade View: Grade II Tube type: Oral Tube size: 7.0 mm Number of attempts: 1 Airway Equipment and Method: stylet Placement Confirmation: ETT inserted through vocal cords under direct vision,  positive ETCO2 and breath sounds checked- equal and bilateral Secured at: 21 cm Tube secured with: Tape Dental Injury: Teeth and Oropharynx as per pre-operative assessment

## 2018-03-18 NOTE — Progress Notes (Signed)
Day of Surgery Procedure(s) (LRB): TOTAL LAPAROSCOPIC HYSTERECTOMY WITH BILATERAL SALPINGECTOMY WITH VAGINAL MORCELLATION CYSTOSCOPY (N/A) LAPAROSCOPIC LYSIS OF ADHESIONS REPAIR PERINEAL LACERATION  Subjective: Patient reports tolerating PO.   Having cramping.   Just received Percocet.  Not out of bed yet.   Objective: I have reviewed patient's vital signs and intake and output. Today's Vitals   03/18/18 1409 03/18/18 1421 03/18/18 1555 03/18/18 1705  BP:  125/70    Pulse:  100    Resp:  18    Temp:  97.6 F (36.4 C)    TempSrc:  Oral    SpO2:  98%    Weight:      Height:      PainSc: 0-No pain  2  3    Body mass index is 36.58 kg/m. I/O - 4403/350 (175 cc since 1:15) WBC 17.5, Hgb 12.9 Cr 0.88  General: alert and cooperative Resp: clear to auscultation bilaterally Cardio: regular rate and rhythm, S1, S2 normal, no murmur, click, rub or gallop GI: soft, non-tender; bowel sounds normal; no masses,  no organomegaly and incision: clean, dry and intact Extremities: PAS and Ted hose on.  DPs 2+ bilaterally.  Vaginal Bleeding: none  Assessment: s/p Procedure(s) with comments: TOTAL LAPAROSCOPIC HYSTERECTOMY WITH BILATERAL SALPINGECTOMY WITH VAGINAL MORCELLATION - please have all sizes of alexis morcellation bag in the room un-opened CYSTOSCOPY (N/A) LAPAROSCOPIC LYSIS OF ADHESIONS REPAIR PERINEAL LACERATION: stable  Decreased urinary output attributed to bowel prep and recent initiation of HCTZ.  Plan: Encourage ambulation Advance to PO medication Continue foley due to urinary output monitoring  IVF bolus LR 500 cc over 2 hours.  CBC in am.  Lovenox in am.  Reviewed surgical findings and procedure with patient and husband.   Questions invited and answered.   LOS: 0 days    Arloa Koh 03/18/2018, 5:32 PM

## 2018-03-19 ENCOUNTER — Encounter (HOSPITAL_COMMUNITY): Payer: Self-pay | Admitting: Obstetrics and Gynecology

## 2018-03-19 DIAGNOSIS — D259 Leiomyoma of uterus, unspecified: Secondary | ICD-10-CM | POA: Diagnosis not present

## 2018-03-19 LAB — CBC
HCT: 33.6 % — ABNORMAL LOW (ref 36.0–46.0)
HEMOGLOBIN: 11.4 g/dL — AB (ref 12.0–15.0)
MCH: 30.9 pg (ref 26.0–34.0)
MCHC: 33.9 g/dL (ref 30.0–36.0)
MCV: 91.1 fL (ref 80.0–100.0)
Platelets: 288 10*3/uL (ref 150–400)
RBC: 3.69 MIL/uL — ABNORMAL LOW (ref 3.87–5.11)
RDW: 12.6 % (ref 11.5–15.5)
WBC: 15.1 10*3/uL — ABNORMAL HIGH (ref 4.0–10.5)
nRBC: 0 % (ref 0.0–0.2)

## 2018-03-19 MED ORDER — IBUPROFEN 800 MG PO TABS: 800.0000 mg | ORAL_TABLET | Freq: Three times a day (TID) | ORAL | 0 refills | Status: DC | PRN

## 2018-03-19 MED ORDER — OXYCODONE-ACETAMINOPHEN 5-325 MG PO TABS: 1.0000 | ORAL_TABLET | ORAL | 0 refills | Status: DC | PRN

## 2018-03-19 MED ORDER — HYDROCHLOROTHIAZIDE 25 MG PO TABS
25.0000 mg | ORAL_TABLET | Freq: Every day | ORAL | Status: DC
  Administered 2018-03-19: 25 mg via ORAL
  Filled 2018-03-19: qty 1

## 2018-03-19 NOTE — Discharge Instructions (Signed)

## 2018-03-19 NOTE — Progress Notes (Signed)
1 Day Post-Op Procedure(s) (LRB): TOTAL LAPAROSCOPIC HYSTERECTOMY WITH BILATERAL SALPINGECTOMY WITH VAGINAL MORCELLATION CYSTOSCOPY (N/A) LAPAROSCOPIC LYSIS OF ADHESIONS REPAIR PERINEAL LACERATION  Subjective: Patient reports ambulating, tolerating regular diet but not so hungry, and taking po pain medication.  No void yet since Foley removed this am.   Objective: I have reviewed patient's vital signs, intake and output and labs. . Vitals:   03/19/18 0337 03/19/18 0738  BP: 132/78 121/77  Pulse: 93 87  Resp: 20 18  Temp: 99.1 F (37.3 C) 99 F (37.2 C)  SpO2: 96% 99%   UO - 2150 cc.   WBC - 15.1  HGB -11.4  General: alert and cooperative Resp: clear to auscultation bilaterally Cardio: regular rate and rhythm, S1, S2 normal, no murmur, click, rub or gallop GI: soft, non-tender; bowel sounds normal; no masses,  no organomegaly and incision: clean, dry and intact Vaginal Bleeding: none  Assessment: s/p Procedure(s) with comments: TOTAL LAPAROSCOPIC HYSTERECTOMY WITH BILATERAL SALPINGECTOMY WITH VAGINAL MORCELLATION - please have all sizes of alexis morcellation bag in the room un-opened CYSTOSCOPY (N/A) LAPAROSCOPIC LYSIS OF ADHESIONS REPAIR PERINEAL LACERATION: progressing well  Ready for discharge after voiding.   Plan: Encourage ambulation Discharge home after voiding.  Rx for Percocet and Motrin.  Fu in 1 week. Instructions and precautions given.  LOS: 0 days    Arloa Koh 03/19/2018, 8:04 AM

## 2018-03-20 ENCOUNTER — Telehealth: Payer: Self-pay | Admitting: Emergency Medicine

## 2018-03-20 NOTE — Telephone Encounter (Signed)
Notes recorded by Nunzio Cobbs, MD on 03/19/2018 at 5:11 PM EST Please report final surgical pathology result of uterus showing fibroid and adenomyosis (endometriosis in the wall of the uterus).  Her cervix and tubes were unremarkable.  I hope she is doing well following her laparoscopic hysterectomy done on 03/18/18.

## 2018-03-20 NOTE — Telephone Encounter (Signed)
Spoke with patient.  She is given pathology results.  Verbalizes understanding of results.  Questions answered about activity. She denies complaints.  Feels well and has post op appointment scheduled for 03/26/2018.  Instructed to call back with any concerns.  Encounter closed.

## 2018-03-26 ENCOUNTER — Other Ambulatory Visit: Payer: Self-pay

## 2018-03-26 ENCOUNTER — Encounter: Payer: Self-pay | Admitting: Obstetrics and Gynecology

## 2018-03-26 ENCOUNTER — Ambulatory Visit (INDEPENDENT_AMBULATORY_CARE_PROVIDER_SITE_OTHER): Payer: BLUE CROSS/BLUE SHIELD | Admitting: Obstetrics and Gynecology

## 2018-03-26 VITALS — BP 130/82 | HR 76 | Temp 98.2°F | Ht 62.0 in | Wt 200.6 lb

## 2018-03-26 DIAGNOSIS — Z9889 Other specified postprocedural states: Secondary | ICD-10-CM

## 2018-03-26 NOTE — Progress Notes (Signed)
GYNECOLOGY  VISIT   HPI: 47 y.o.   Married  [place] American  female   [phone] with Patient's last menstrual period was 03/09/2018.   here for 1 week follow up [place] SALPINGECTOMY WITH VAGINAL MORCELLATION CYSTOSCOPY (N/A ) LAPAROSCOPIC LYSIS OF ADHESIONS REPAIR PERINEAL LACERATION.  Final pathology - fibroids, adenomyosis, unremarkable cervix and tubes.   Doing ok overall at home.   Using Ibuprofen once daily.  Right incision is the most sore.  No vaginal bleeding.   Eating regular food. Having BMs and voiding well.  Walking a lot in her home.   WBC 15.8 at time of discharge on 03/19/18. WBC was 17.5 on 03/18/18.  Hgb 13.8 now.   GYNECOLOGIC HISTORY: Patient's last menstrual period was 03/09/2018. Contraception: Vasectomy Menopausal hormone therapy:  none Last mammogram: 03-03-18 Neg/density B/BiRads1 Last pap smear:  01-13-18 Neg:Neg HR HPV        OB History    Gravida  3   Para  2   Term  2   Preterm  0   AB  1   Living  2     SAB  0   TAB  0   Ectopic  0   Multiple  0   Live Births  2              Patient Active Problem List   Diagnosis Date Noted  . Status post laparoscopic hysterectomy 03/18/2018  . Fibroids 11/21/2016    Past Medical History:  Diagnosis Date  . Abnormal uterine bleeding   . Anemia   . Anxiety    no meds  . Depression    no meds  . Fibroid   . Hyperlipidemia    no med, diet controlled  . Superficial thrombophlebitis 2005   One episode.  Treated with ASA.  . SVD (spontaneous vaginal delivery) 10/1991   x 1    Past Surgical History:  Procedure Laterality Date  . CESAREAN SECTION  04/1988   x 1  . CYSTOSCOPY N/A 03/18/2018   Procedure: CYSTOSCOPY;  Surgeon: Nunzio Cobbs, MD;  Location: [facility];  Service: Gynecology;  Laterality: N/A;  . HERNIA REPAIR     supraumbilical herniorrhaphy after birth of second child.  [facility] in [place].   No mesh per  patient.  Marland Kitchen LAPAROSCOPIC LYSIS OF ADHESIONS  03/18/2018   Procedure: LAPAROSCOPIC LYSIS OF ADHESIONS;  Surgeon: Nunzio Cobbs, MD;  Location: [facility];  Service: Gynecology;;  . OOPHORECTOMY Right   . PERINEAL LACERATION REPAIR  03/18/2018   Procedure: REPAIR PERINEAL LACERATION;  Surgeon: Nunzio Cobbs, MD;  Location: [facility];  Service: Gynecology;;  . TOTAL LAPAROSCOPIC HYSTERECTOMY WITH SALPINGECTOMY  03/18/2018   Procedure: TOTAL LAPAROSCOPIC HYSTERECTOMY WITH BILATERAL SALPINGECTOMY WITH VAGINAL MORCELLATION;  Surgeon: Nunzio Cobbs, MD;  Location: [facility];  Service: Gynecology;;  please have all sizes of alexis morcellation bag in the room un-opened  . WISDOM TOOTH EXTRACTION      Current Outpatient Medications  Medication Sig Dispense Refill  . hydrochlorothiazide (MICROZIDE) 12.5 MG capsule Take 2 capsules (25 mg total) by mouth daily. 90 capsule 0  . hydrOXYzine (ATARAX/VISTARIL) 25 MG tablet Take 1 tablet (25 mg total) by mouth 3 (three) times daily as needed for anxiety. 90 tablet 0  . ibuprofen (ADVIL,MOTRIN) 800 MG tablet Take 1 tablet (800 mg total) by mouth every 8 (eight) hours as needed (mild pain).  30 tablet 0  . oxyCODONE-acetaminophen (PERCOCET/ROXICET) 5-325 MG tablet Take 1-2 tablets by mouth every 4 (four) hours as needed for moderate pain. 30 tablet 0   No current facility-administered medications for this visit.      ALLERGIES: Patient has no known allergies.  Family History  Problem Relation Age of Onset  . Hypertension Mother   . Hyperlipidemia Mother   . Hypertension Father   . Hyperlipidemia Father   . Congestive Heart Failure Father     Social History   Socioeconomic History  . Marital status: Married    Spouse name: Doren Custard  . Number of children: 2  . Years of education: Not on file  . Highest education level: Not on file  Occupational History  . Not on file  Social Needs  . Financial resource strain: Not on  file  . Food insecurity:    Worry: Not on file    Inability: Not on file  . Transportation needs:    Medical: Not on file    Non-medical: Not on file  Tobacco Use  . Smoking status: Never Smoker  . Smokeless tobacco: Never Used  Substance and Sexual Activity  . Alcohol use: No  . Drug use: No  . Sexual activity: Yes    Partners: Male    Birth control/protection: Other-see comments, Injection    Comment: husband with vasectomy,depo inj 01/2018  Lifestyle  . Physical activity:    Days per week: Not on file    Minutes per session: Not on file  . Stress: Not on file  Relationships  . Social connections:    Talks on phone: Not on file    Gets together: Not on file    Attends religious service: Not on file    Active member of club or organization: Not on file    Attends meetings of clubs or organizations: Not on file    Relationship status: Not on file  . Intimate partner violence:    Fear of current or ex partner: Not on file    Emotionally abused: Not on file    Physically abused: Not on file    Forced sexual activity: Not on file  Other Topics Concern  . Not on file  Social History Narrative  . Not on file    Review of Systems  All other systems reviewed and are negative.   PHYSICAL EXAMINATION:    BP 130/82 (BP Location: Right Arm, Patient Position: Sitting, Cuff Size: Normal)   Pulse 76   Temp 98.2 F (36.8 C) (Oral)   Ht 5\' 2"  (1.575 m)   Wt 200 lb 9.6 oz (91 kg)   LMP 03/09/2018 Comment: Depo Injection 01/2018  BMI 36.69 kg/m     General appearance: alert, cooperative and appears stated age   Abdomen:  Incisions well healed with Dermabond on them.  No erythema or tenderness.  Abdomen is soft and nontender.   Chaperone was present for exam.  ASSESSMENT  Status post total laparoscopic hysterectomy with bilateral salpingectomy, lysis of adhesions, and vaginal morcellation of uterus in Alexis bag, vaginal repair of perineal laceration, cystoscopy.   Elevated WBC post op.   PLAN  CBC with diff.  Temp check now - 98.2. Surgical pathology report reviewed with patient. Discussed the importance of ambulation but not exercise. FU for 6 week post op, sooner as needed.   An After Visit Summary was printed and given to the patient.

## 2018-03-27 LAB — CBC WITH DIFFERENTIAL/PLATELET
BASOS: 1 %
Basophils Absolute: 0.1 10*3/uL (ref 0.0–0.2)
EOS (ABSOLUTE): 0.2 10*3/uL (ref 0.0–0.4)
EOS: 1 %
HEMOGLOBIN: 13.6 g/dL (ref 11.1–15.9)
Hematocrit: 40.3 % (ref 34.0–46.6)
IMMATURE GRANULOCYTES: 1 %
Immature Grans (Abs): 0.1 10*3/uL (ref 0.0–0.1)
LYMPHS ABS: 2.4 10*3/uL (ref 0.7–3.1)
Lymphs: 19 %
MCH: 30.5 pg (ref 26.6–33.0)
MCHC: 33.7 g/dL (ref 31.5–35.7)
MCV: 90 fL (ref 79–97)
MONOCYTES: 7 %
Monocytes Absolute: 0.9 10*3/uL (ref 0.1–0.9)
Neutrophils Absolute: 9.2 10*3/uL — ABNORMAL HIGH (ref 1.4–7.0)
Neutrophils: 71 %
Platelets: 393 10*3/uL (ref 150–450)
RBC: 4.46 x10E6/uL (ref 3.77–5.28)
RDW: 12.3 % (ref 12.3–15.4)
WBC: 12.8 10*3/uL — AB (ref 3.4–10.8)

## 2018-03-28 NOTE — Discharge Summary (Signed)
Physician Discharge Summary  Patient ID: Melanie Hopkins MRN: [id number] DOB/AGE: 1970-07-25 47 y.o.  Admit date: 03/18/2018 Discharge date:  03/19/18  Admission Diagnoses: 1.  Uterine fibroids.  2.  Status post total laparoscopic hysterectomy with bilateral salpingectomy, lysis of adhesions, vaginal morcellation of uterus in Alexis bag, perineal laceration repair, cystoscopy.  Discharge Diagnoses:  1.  Uterine fibroids.  2.  Status post total laparoscopic hysterectomy with bilateral salpingectomy, lysis of adhesions, vaginal morcellation of uterus in Alexis bag, perineal laceration repair, cystoscopy.  Active Problems:   Status post laparoscopic hysterectomy   Discharged Condition: good  Hospital Course:     The patient was admitted on 03/18/18 for a total laparoscopic hysterectomy with bilateral salpingectomy, lysis of adhesions, vaginal morcellation of uterus in Alexis bag, and cystoscopy which were performed while under general anesthesia. The patient had an intraoperative small laceration of the perineum which was attributed to the weight speculum. This was repaired with Vicryl suture. The patient's post op course was uneventful.  She received Toradol and Percocet for pain control initially, and this was converted over to Percocet and Motrin when the patient began taking po well.  She ambulated independently and wore PAS and Ted hose for DVT prophylaxis while in bed.  She did receive Lovenox the day of surgery and post op day number one prior to discharge.  Her foley catheter were removed on post op day one, and she voided good volumes. The patient's vital signs remained stable and she demonstrated no signs of infection during her hospitalization. The day of surgery WBC was 17.5, and this was attributed to the extensive uterine morcellation required to remove her enlarged uterus vaginally.  The patient's post op day one WBC was 15.1 and her Hgb was 11.4.  She was tolerating the this well.   She had very minimal vaginal bleeding, and her incision(s) demonstrated no signs of erythema or significant drainage.  She was found to be in good condition and ready for discharge on post op day one.  Consults: None  Significant Diagnostic Studies: labs:  See Hospital Course.  Treatments: surgery:  total laparoscopic hysterectomy with bilateral salpingectomy, lysis of adhesions, vaginal morcellation of uterus in Alexis bag, perineal laceration repair, cystoscopy.   Discharge Exam: Blood pressure 116/71, pulse 84, temperature 99.5 F (37.5 C), temperature source Oral, resp. rate 18, height 5\' 2"  (1.575 m), weight 90.7 kg, last menstrual period 03/09/2018, SpO2 100 %. General: alert and cooperative Resp: clear to auscultation bilaterally Cardio: regular rate and rhythm, S1, S2 normal, no murmur, click, rub or gallop GI: soft, non-tender; bowel sounds normal; no masses,  no organomegaly and incision: clean, dry and intact Vaginal Bleeding: none   Disposition: Discharge disposition: 01-Home or Self Care     Discharge instructions were reviewed in verbal and written form.   Discharge Instructions    Discharge patient   Complete by:  As directed    Needs to void first.   Discharge disposition:  01-Home or Self Care   Discharge patient date:  03/19/2018     Allergies as of 03/19/2018   No Known Allergies     Medication List    STOP taking these medications   medroxyPROGESTERone 150 MG/ML injection Commonly known as:  DEPO-PROVERA   SLOW FE 142 (45 Fe) MG Tbcr Generic drug:  Ferrous Sulfate     TAKE these medications   hydrochlorothiazide 12.5 MG capsule Commonly known as:  MICROZIDE Take 2 capsules (25 mg total) by mouth daily.  hydrOXYzine 25 MG tablet Commonly known as:  ATARAX/VISTARIL Take 1 tablet (25 mg total) by mouth 3 (three) times daily as needed for anxiety.   ibuprofen 800 MG tablet Commonly known as:  ADVIL,MOTRIN Take 1 tablet (800 mg total) by  mouth every 8 (eight) hours as needed (mild pain).   oxyCODONE-acetaminophen 5-325 MG tablet Commonly known as:  PERCOCET/ROXICET Take 1-2 tablets by mouth every 4 (four) hours as needed for moderate pain.      Follow-up Information    Nunzio Cobbs, MD In 1 week.   Specialty:  Obstetrics and Gynecology Contact information: [address] [place] [address] [phone]           Signed: Arloa Koh 03/28/2018, 3:02 PM

## 2018-03-30 ENCOUNTER — Other Ambulatory Visit: Payer: Self-pay | Admitting: Family Medicine

## 2018-04-01 ENCOUNTER — Telehealth: Payer: Self-pay

## 2018-04-01 NOTE — Telephone Encounter (Signed)
-----   Message from Nunzio Cobbs, MD sent at 03/28/2018  2:34 PM EST ----- Please report final CBC results to patient.  Her Hgb is normal now.  Her white blood cell count is normalizing following surgery.  I will see her back for her 6 week recheck appointment.

## 2018-04-01 NOTE — Telephone Encounter (Signed)
Spoke with patient. Results given. Patient verbalizes understanding. Patient states that the "glue" around her incision has started to peel up a little. Patient is gently cleansing the area. Asking if this is okay. Advised this is normal and okay to gently cleanse. Advised to call if she has any additional questions or concerns. Patient is agreeable.  Routing to provider and will close encounter.

## 2018-04-11 ENCOUNTER — Telehealth: Payer: Self-pay | Admitting: Obstetrics and Gynecology

## 2018-04-11 NOTE — Telephone Encounter (Signed)
Patient had surgery recently and is asking "when can I drive"? She said you may leave details on her voicemail.

## 2018-04-11 NOTE — Telephone Encounter (Signed)
S/p TLH 03/18/2018.   Call returned to patient, left detailed message. Advised ok to drive as long as no longer taking narcotic pain medication. Return call to office if any additional questions/concerns.   Routing to provider for final review. Patient is agreeable to disposition. Will close encounter.

## 2018-04-16 ENCOUNTER — Other Ambulatory Visit: Payer: Self-pay

## 2018-04-16 MED ORDER — HYDROXYZINE HCL 25 MG PO TABS: 25.0000 mg | ORAL_TABLET | Freq: Three times a day (TID) | ORAL | 0 refills | Status: DC | PRN

## 2018-04-30 DIAGNOSIS — D72829 Elevated white blood cell count, unspecified: Secondary | ICD-10-CM

## 2018-04-30 DIAGNOSIS — R7989 Other specified abnormal findings of blood chemistry: Secondary | ICD-10-CM

## 2018-04-30 HISTORY — DX: Elevated white blood cell count, unspecified: D72.829

## 2018-04-30 HISTORY — DX: Other specified abnormal findings of blood chemistry: R79.89

## 2018-05-01 ENCOUNTER — Encounter: Payer: Self-pay | Admitting: Obstetrics and Gynecology

## 2018-05-01 ENCOUNTER — Ambulatory Visit (INDEPENDENT_AMBULATORY_CARE_PROVIDER_SITE_OTHER): Payer: BLUE CROSS/BLUE SHIELD | Admitting: Obstetrics and Gynecology

## 2018-05-01 ENCOUNTER — Other Ambulatory Visit: Payer: Self-pay

## 2018-05-01 VITALS — BP 148/94 | HR 84 | Resp 14 | Ht 62.0 in | Wt 205.4 lb

## 2018-05-01 DIAGNOSIS — D72829 Elevated white blood cell count, unspecified: Secondary | ICD-10-CM

## 2018-05-01 DIAGNOSIS — Z9889 Other specified postprocedural states: Secondary | ICD-10-CM

## 2018-05-01 NOTE — Progress Notes (Signed)
GYNECOLOGY  VISIT   HPI: 48 y.o.   Married  [place] American  female   [phone] with Patient's last menstrual period was 03/09/2018. here for 6 week follow up [place] SALPINGECTOMY WITH VAGINAL MORCELLATION CYSTOSCOPY (N/A ) LAPAROSCOPIC LYSIS OF ADHESIONS REPAIR PERINEAL LACERATION.   For the most part feels amazing.   Right incision is still a little tender.   GYNECOLOGIC HISTORY: Patient's last menstrual period was 03/09/2018. Contraception: Vasectomy/Hysterectomy Menopausal hormone therapy:  none Last mammogram:  03-03-18 Neg/density B/BiRads1 Last pap smear: 01-13-18 Neg:Neg HR HPV        OB History    Gravida  3   Para  2   Term  2   Preterm  0   AB  1   Living  2     SAB  0   TAB  0   Ectopic  0   Multiple  0   Live Births  2              Patient Active Problem List   Diagnosis Date Noted  . Status post laparoscopic hysterectomy 03/18/2018    Past Medical History:  Diagnosis Date  . Abnormal uterine bleeding   . Anemia   . Anxiety    no meds  . Depression    no meds  . Fibroid   . Hyperlipidemia    no med, diet controlled  . Superficial thrombophlebitis 2005   One episode.  Treated with ASA.  . SVD (spontaneous vaginal delivery) 10/1991   x 1    Past Surgical History:  Procedure Laterality Date  . CESAREAN SECTION  04/1988   x 1  . CYSTOSCOPY N/A 03/18/2018   Procedure: CYSTOSCOPY;  Surgeon: Nunzio Cobbs, MD;  Location: [facility];  Service: Gynecology;  Laterality: N/A;  . HERNIA REPAIR     supraumbilical herniorrhaphy after birth of second child.  [facility] in [place].   No mesh per patient.  Marland Kitchen LAPAROSCOPIC LYSIS OF ADHESIONS  03/18/2018   Procedure: LAPAROSCOPIC LYSIS OF ADHESIONS;  Surgeon: Nunzio Cobbs, MD;  Location: [facility];  Service: Gynecology;;  . OOPHORECTOMY Right   . PERINEAL LACERATION REPAIR  03/18/2018   Procedure: REPAIR PERINEAL LACERATION;   Surgeon: Nunzio Cobbs, MD;  Location: [facility];  Service: Gynecology;;  . TOTAL LAPAROSCOPIC HYSTERECTOMY WITH SALPINGECTOMY  03/18/2018   Procedure: TOTAL LAPAROSCOPIC HYSTERECTOMY WITH BILATERAL SALPINGECTOMY WITH VAGINAL MORCELLATION;  Surgeon: Nunzio Cobbs, MD;  Location: [facility];  Service: Gynecology;;  please have all sizes of alexis morcellation bag in the room un-opened  . WISDOM TOOTH EXTRACTION      Current Outpatient Medications  Medication Sig Dispense Refill  . hydrochlorothiazide (MICROZIDE) 12.5 MG capsule Take 2 capsules (25 mg total) by mouth daily. 90 capsule 0  . hydrOXYzine (ATARAX/VISTARIL) 25 MG tablet Take 1 tablet (25 mg total) by mouth 3 (three) times daily as needed for anxiety. (Patient not taking: Reported on 05/01/2018) 90 tablet 0   No current facility-administered medications for this visit.      ALLERGIES: Patient has no known allergies.  Family History  Problem Relation Age of Onset  . Hypertension Mother   . Hyperlipidemia Mother   . Hypertension Father   . Hyperlipidemia Father   . Congestive Heart Failure Father     Social History   Socioeconomic History  . Marital status: Married    Spouse name: Doren Custard  .  Number of children: 2  . Years of education: Not on file  . Highest education level: Not on file  Occupational History  . Not on file  Social Needs  . Financial resource strain: Not on file  . Food insecurity:    Worry: Not on file    Inability: Not on file  . Transportation needs:    Medical: Not on file    Non-medical: Not on file  Tobacco Use  . Smoking status: Never Smoker  . Smokeless tobacco: Never Used  Substance and Sexual Activity  . Alcohol use: No  . Drug use: No  . Sexual activity: Yes    Partners: Male    Birth control/protection: Other-see comments, Injection    Comment: husband with vasectomy,depo inj 01/2018  Lifestyle  . Physical activity:    Days per week: Not on file    Minutes per  session: Not on file  . Stress: Not on file  Relationships  . Social connections:    Talks on phone: Not on file    Gets together: Not on file    Attends religious service: Not on file    Active member of club or organization: Not on file    Attends meetings of clubs or organizations: Not on file    Relationship status: Not on file  . Intimate partner violence:    Fear of current or ex partner: Not on file    Emotionally abused: Not on file    Physically abused: Not on file    Forced sexual activity: Not on file  Other Topics Concern  . Not on file  Social History Narrative  . Not on file    Review of Systems  Constitutional: Negative.   HENT: Negative.   Eyes: Negative.   Respiratory: Negative.   Cardiovascular: Negative.   Gastrointestinal:       Right side intermittent pain near incision    Endocrine: Negative.   Genitourinary: Negative.   Musculoskeletal: Negative.   Skin: Negative.   Allergic/Immunologic: Negative.   Neurological: Negative.   Hematological: Negative.   Psychiatric/Behavioral: Negative.     PHYSICAL EXAMINATION:    BP (!) 148/94 (BP Location: Right Arm, Patient Position: Sitting, Cuff Size: Large)   Pulse 84   Resp 14   Ht 5\' 2"  (1.575 m)   Wt 205 lb 6.4 oz (93.2 kg)   LMP 03/09/2018 Comment: Depo Injection 01/2018  BMI 37.57 kg/m     General appearance: alert, cooperative and appears stated age   Abdomen: incisions intact.  Abdomen is soft, non-tender, no masses,  no organomegaly   Pelvic: External genitalia:  no lesions              Urethra:  normal appearing urethra with no masses, tenderness or lesions              Bartholins and Skenes: normal                 Vagina: normal appearing vagina with normal color and discharge, no lesions              Cervix:  Absent.  No visible or palpable suture of vaginal apex or perineum.                Bimanual Exam:  Uterus:   absent              Adnexa: no mass, fullness, tenderness  Chaperone was present for exam.  ASSESSMENT  Status post laparoscopic hysterectomy with bilateral salpingectomy, LOA, cystoscopy, repair of small perineal laceration.  Doing well.  Elevated WBC.  PLAN  Return to normal activities in 2 weeks - exercise and sexual activity.  OK to return to work. FU for annual exam.    An After Visit Summary was printed and given to the patient.

## 2018-05-02 LAB — CBC WITH DIFFERENTIAL/PLATELET
BASOS ABS: 0.1 10*3/uL (ref 0.0–0.2)
Basos: 1 %
EOS (ABSOLUTE): 0.2 10*3/uL (ref 0.0–0.4)
EOS: 2 %
Hematocrit: 40 % (ref 34.0–46.6)
Hemoglobin: 13.4 g/dL (ref 11.1–15.9)
IMMATURE GRANULOCYTES: 1 %
Immature Grans (Abs): 0.1 10*3/uL (ref 0.0–0.1)
LYMPHS ABS: 2.7 10*3/uL (ref 0.7–3.1)
Lymphs: 26 %
MCH: 30.5 pg (ref 26.6–33.0)
MCHC: 33.5 g/dL (ref 31.5–35.7)
MCV: 91 fL (ref 79–97)
MONOS ABS: 0.6 10*3/uL (ref 0.1–0.9)
Monocytes: 6 %
NEUTROS PCT: 64 %
Neutrophils Absolute: 6.7 10*3/uL (ref 1.4–7.0)
PLATELETS: 349 10*3/uL (ref 150–450)
RBC: 4.39 x10E6/uL (ref 3.77–5.28)
RDW: 12.5 % (ref 12.3–15.4)
WBC: 10.4 10*3/uL (ref 3.4–10.8)

## 2018-09-03 ENCOUNTER — Telehealth: Payer: Self-pay

## 2018-09-03 NOTE — Telephone Encounter (Signed)
Left detailed voicemail message on patient's cell phone requesting a call back to schedule an appt to follow up for  her blood pressure.

## 2019-01-13 ENCOUNTER — Other Ambulatory Visit: Payer: Self-pay

## 2019-01-15 ENCOUNTER — Ambulatory Visit: Payer: BLUE CROSS/BLUE SHIELD | Admitting: Obstetrics and Gynecology

## 2019-02-06 ENCOUNTER — Ambulatory Visit: Payer: BC Managed Care – PPO | Admitting: Obstetrics and Gynecology

## 2019-02-06 ENCOUNTER — Encounter: Payer: Self-pay | Admitting: Obstetrics and Gynecology

## 2019-02-06 ENCOUNTER — Other Ambulatory Visit: Payer: Self-pay

## 2019-02-06 VITALS — BP 138/76 | HR 80 | Temp 97.3°F | Resp 12 | Ht 63.0 in | Wt 207.0 lb

## 2019-02-06 DIAGNOSIS — Z01419 Encounter for gynecological examination (general) (routine) without abnormal findings: Secondary | ICD-10-CM | POA: Diagnosis not present

## 2019-02-06 DIAGNOSIS — Z1211 Encounter for screening for malignant neoplasm of colon: Secondary | ICD-10-CM | POA: Diagnosis not present

## 2019-02-06 NOTE — Progress Notes (Signed)
48 y.o. [id number] Married [place] American female here for annual exam.    No hot flashes. Not sleeping well for a long time.  Tried a new sleepy time tea which helped.  PCP: Orma Flaming, MD     Patient's last menstrual period was 03/09/2018.           Sexually active: Yes.    The current method of family planning is status post hysterectomy.    Exercising: Yes.    walking 4 miles every other day. Smoker:  no  Health Maintenance: Pap:  01-13-18 Neg:Neg HR HPV History of abnormal Pap:  Yes, a few years ago MMG:  03-03-18 Neg/density B/BiRads1 Colonoscopy:  none BMD:   n/a  Result  n/a TDaP:  UTD per patient Gardasil:   n/a HIV: negative in the past Hep C: negative in the past Screening Labs:  PCP Flu vaccine:  Declined.   reports that she has never smoked. She has never used smokeless tobacco. She reports that she does not drink alcohol or use drugs.  Past Medical History:  Diagnosis Date  . Abnormal uterine bleeding   . Anemia   . Anxiety    no meds  . Depression    no meds  . Fibroid   . Hyperlipidemia    no med, diet controlled  . Superficial thrombophlebitis 2005   One episode.  Treated with ASA.  . SVD (spontaneous vaginal delivery) 10/1991   x 1    Past Surgical History:  Procedure Laterality Date  . CESAREAN SECTION  04/1988   x 1  . CYSTOSCOPY N/A 03/18/2018   Procedure: CYSTOSCOPY;  Surgeon: Nunzio Cobbs, MD;  Location: [facility];  Service: Gynecology;  Laterality: N/A;  . HERNIA REPAIR     supraumbilical herniorrhaphy after birth of second child.  [facility] [place].   No mesh per patient.  Marland Kitchen LAPAROSCOPIC LYSIS OF ADHESIONS  03/18/2018   Procedure: LAPAROSCOPIC LYSIS OF ADHESIONS;  Surgeon: Nunzio Cobbs, MD;  Location: [facility];  Service: Gynecology;;  . OOPHORECTOMY Right   . PERINEAL LACERATION REPAIR  03/18/2018   Procedure: REPAIR PERINEAL LACERATION;  Surgeon: Nunzio Cobbs, MD;  Location: [facility];   Service: Gynecology;;  . TOTAL LAPAROSCOPIC HYSTERECTOMY WITH SALPINGECTOMY  03/18/2018   Procedure: TOTAL LAPAROSCOPIC HYSTERECTOMY WITH BILATERAL SALPINGECTOMY WITH VAGINAL MORCELLATION;  Surgeon: Nunzio Cobbs, MD;  Location: [facility];  Service: Gynecology;;  please have all sizes of alexis morcellation bag in the room un-opened  . WISDOM TOOTH EXTRACTION      No current outpatient medications on file.   No current facility-administered medications for this visit.     Family History  Problem Relation Age of Onset  . Hypertension Mother   . Hyperlipidemia Mother   . Hypertension Father   . Hyperlipidemia Father   . Congestive Heart Failure Father     Review of Systems  Constitutional: Negative.   HENT: Negative.   Eyes: Negative.   Respiratory: Negative.   Cardiovascular: Negative.   Gastrointestinal: Negative.   Endocrine: Negative.   Genitourinary: Negative.   Musculoskeletal: Negative.   Skin: Negative.   Allergic/Immunologic: Negative.   Neurological: Negative.   Hematological: Negative.   Psychiatric/Behavioral: Negative.     Exam:   BP 138/76 (BP Location: Right Arm, Patient Position: Sitting, Cuff Size: Normal)   Pulse 80   Temp (!) 97.3 F (36.3 C) (Temporal)   Resp 12  Ht 5\' 3"  (1.6 m)   Wt 207 lb (93.9 kg)   LMP 03/09/2018 Comment: Depo Injection 01/2018  BMI 36.67 kg/m     General appearance: alert, cooperative and appears stated age Head: normocephalic, without obvious abnormality, atraumatic Neck: no adenopathy, supple, symmetrical, trachea midline and thyroid normal to inspection and palpation Lungs: clear to auscultation bilaterally Breasts: normal appearance, no masses or tenderness, No nipple retraction or dimpling, No nipple discharge or bleeding, No axillary adenopathy Heart: regular rate and rhythm Abdomen: soft, non-tender; no masses, no organomegaly Extremities: extremities normal, atraumatic, no cyanosis or edema Skin: skin  color, texture, turgor normal. No rashes or lesions Lymph nodes: cervical, supraclavicular, and axillary nodes normal. Neurologic: grossly normal  Pelvic: External genitalia:  no lesions              No abnormal inguinal nodes palpated.              Urethra:  normal appearing urethra with no masses, tenderness or lesions              Bartholins and Skenes: normal                 Vagina: normal appearing vagina with normal color and discharge, no lesions              Cervix:  absent              Pap taken: No. Bimanual Exam:  Uterus:   absent              Adnexa: no mass, fullness, tenderness              Rectal exam: Yes.  .  Confirms.              Anus:  normal sphincter tone, no lesions  Chaperone was present for exam.  Assessment:   Well woman visit with normal exam. Status post laparoscopic hysterectomy with bilateral salpingectomy, LOA, cystoscopy, repair of small perineal laceration. Status post right oophorectomy.  Hx superficial thrombophlebitis.  Supraumbilical hernia.  Sleep disturbance. Hx elevated glucose and normal A1C.  Plan: Mammogram screening discussed. Self breast awareness reviewed. Pap and HR HPV as above. Guidelines for Calcium, Vitamin D, regular exercise program including cardiovascular and weight bearing exercise. Try melatonin. IFOB.  Routine labs and A1C.  Flu vaccine recommended.   Follow up annually and prn.   After visit summary provided.

## 2019-02-06 NOTE — Patient Instructions (Signed)

## 2019-02-07 LAB — COMPREHENSIVE METABOLIC PANEL
ALT: 36 IU/L — ABNORMAL HIGH (ref 0–32)
AST: 25 IU/L (ref 0–40)
Albumin/Globulin Ratio: 1.6 (ref 1.2–2.2)
Albumin: 4.6 g/dL (ref 3.8–4.8)
Alkaline Phosphatase: 64 IU/L (ref 39–117)
BUN/Creatinine Ratio: 8 — ABNORMAL LOW (ref 9–23)
BUN: 6 mg/dL (ref 6–24)
Bilirubin Total: 0.4 mg/dL (ref 0.0–1.2)
CO2: 25 mmol/L (ref 20–29)
Calcium: 9.7 mg/dL (ref 8.7–10.2)
Chloride: 100 mmol/L (ref 96–106)
Creatinine, Ser: 0.72 mg/dL (ref 0.57–1.00)
GFR calc Af Amer: 115 mL/min/{1.73_m2} (ref >59)
GFR calc non Af Amer: 99 mL/min/{1.73_m2} (ref >59)
Globulin, Total: 2.8 g/dL (ref 1.5–4.5)
Glucose: 94 mg/dL (ref 65–99)
Potassium: 4.2 mmol/L (ref 3.5–5.2)
Sodium: 138 mmol/L (ref 134–144)
Total Protein: 7.4 g/dL (ref 6.0–8.5)

## 2019-02-07 LAB — CBC
Hematocrit: 40.5 % (ref 34.0–46.6)
Hemoglobin: 13.8 g/dL (ref 11.1–15.9)
MCH: 31.2 pg (ref 26.6–33.0)
MCHC: 34.1 g/dL (ref 31.5–35.7)
MCV: 92 fL (ref 79–97)
Platelets: 329 10*3/uL (ref 150–450)
RBC: 4.42 x10E6/uL (ref 3.77–5.28)
RDW: 12.7 % (ref 11.7–15.4)
WBC: 11.1 10*3/uL — ABNORMAL HIGH (ref 3.4–10.8)

## 2019-02-07 LAB — LIPID PANEL
Chol/HDL Ratio: 4.6 ratio — ABNORMAL HIGH (ref 0.0–4.4)
Cholesterol, Total: 216 mg/dL — ABNORMAL HIGH (ref 100–199)
HDL: 47 mg/dL (ref >39)
LDL Chol Calc (NIH): 113 mg/dL — ABNORMAL HIGH (ref 0–99)
Triglycerides: 323 mg/dL — ABNORMAL HIGH (ref 0–149)
VLDL Cholesterol Cal: 56 mg/dL — ABNORMAL HIGH (ref 5–40)

## 2019-02-07 LAB — HEMOGLOBIN A1C
Est. average glucose Bld gHb Est-mCnc: 111 mg/dL
Hgb A1c MFr Bld: 5.5 % (ref 4.8–5.6)

## 2019-02-11 ENCOUNTER — Encounter: Payer: Self-pay | Admitting: Obstetrics and Gynecology

## 2019-02-11 ENCOUNTER — Telehealth: Payer: Self-pay | Admitting: *Deleted

## 2019-02-11 NOTE — Telephone Encounter (Signed)
Patient is returning call to Jill. °

## 2019-02-11 NOTE — Telephone Encounter (Signed)
-----   Message from Nunzio Cobbs, MD sent at 02/11/2019 12:34 PM EDT ----- Please contact patient with her blood work.  Her cholesterol and triglycerides are elevated.  I recommend she start a diet low in saturated fats and cholesterol.  One of her liver enzymes and her white blood cell count are also slightly elevated.   Her blood sugar testing is normal.  I would recommend she follow up with her PCP for further evaluation.

## 2019-02-11 NOTE — Telephone Encounter (Signed)
Notes recorded by Burnice Logan, RN on 02/11/2019 at 3:44 PM EDT  Left message to call Sharee Pimple, RN at [facility].

## 2019-02-11 NOTE — Telephone Encounter (Signed)
Call returned to patient, no answer.

## 2019-02-13 NOTE — Telephone Encounter (Signed)
Left message to call Kallyn Demarcus, RN at [facility] [phone].   

## 2019-02-18 NOTE — Telephone Encounter (Signed)
Spoke with patient. Advised per Dr. Quincy Simmonds. Copy of labs faxed via Epic to PCP/Dr. Rogers Blocker. Patient will contact PCP directly to schedule f/u. Patient verbalizes understanding and is agreeable.   Encounter closed.

## 2019-02-25 ENCOUNTER — Encounter: Payer: Self-pay | Admitting: Family Medicine

## 2019-02-25 ENCOUNTER — Ambulatory Visit (INDEPENDENT_AMBULATORY_CARE_PROVIDER_SITE_OTHER): Payer: BC Managed Care – PPO | Admitting: Family Medicine

## 2019-02-25 VITALS — Ht 63.0 in | Wt 200.0 lb

## 2019-02-25 DIAGNOSIS — R7401 Elevation of levels of liver transaminase levels: Secondary | ICD-10-CM

## 2019-02-25 DIAGNOSIS — I10 Essential (primary) hypertension: Secondary | ICD-10-CM | POA: Insufficient documentation

## 2019-02-25 DIAGNOSIS — R899 Unspecified abnormal finding in specimens from other organs, systems and tissues: Secondary | ICD-10-CM | POA: Diagnosis not present

## 2019-02-25 NOTE — Progress Notes (Signed)
Patient: Melanie Hopkins MRN: [id number] DOB: May 19, 1970 PCP: Orma Flaming, MD     I connected with Georgia Duff on 02/25/19 at 2:03pm by a video enabled telemedicine application and verified that I am speaking with the correct person using two identifiers.  Location patient: Home Location provider: [facility], Office Persons participating in this virtual visit: Brittani Glidewell and Dr. Rogers Blocker   I discussed the limitations of evaluation and management by telemedicine and the availability of in person appointments. The patient expressed understanding and agreed to proceed.   Subjective:  Chief Complaint  Patient presents with  . follow up on bloodwork    ordered by Dr. Josefa Half    HPI: The patient is a 48 y.o. female who presents today for discuss recent labs that were ordered by Dr. Josefa Half.  She had some abnormal findings and was told to follow up with me.   Hyperlipidemia and hypertriglyceridemia: Her last lipid panel in 2019 was essentially normal. Her triglycerides were normal and so was her total cholesterol. Her LDL was slightly elevated at 121. She had labs done at her well woman exam and was not fasting. Her lipid panel was drastically different. She has lost weight, cut out bad foods and is working out. TD went up to 323, total chol was 216 and her LDL was better at 113.   She did start drink to shrink to lose weight. She drinks 2 cups/day. She has been doing this 1.5 months. She has lost 9 pounds since starting this.   Elevated transaminase: ALT is hardly elevated at 36. She drinks no alcohol and she does not take tylenol.   Other abnormal lab was a WBC of 11.1. no diff was done.    Review of Systems  Constitutional: Negative for fatigue.  Eyes: Negative for visual disturbance.  Respiratory: Negative for shortness of breath.   Cardiovascular: Negative for chest pain, palpitations and leg swelling.  Gastrointestinal: Negative for abdominal pain, diarrhea,  nausea and vomiting.  Neurological: Negative for dizziness and headaches.  Hematological: Negative for adenopathy. Does not bruise/bleed easily.  Psychiatric/Behavioral: Negative for sleep disturbance.    Allergies Patient has No Known Allergies.  Past Medical History Patient  has a past medical history of Abnormal uterine bleeding, Anemia, Anxiety, Depression, Elevated liver function tests (2020), Elevated WBC count (2020), Fibroid, Hyperlipidemia, Superficial thrombophlebitis (2005), and SVD (spontaneous vaginal delivery) (10/1991).  Surgical History Patient  has a past surgical history that includes Cesarean section (04/1988); Hernia repair; Oophorectomy (Right); Wisdom tooth extraction; Total laparoscopic hysterectomy with salpingectomy (03/18/2018); Cystoscopy (N/A, 03/18/2018); Laparoscopic lysis of adhesions (03/18/2018); and Perineal laceration repair (03/18/2018).  Family History Pateint's family history includes Congestive Heart Failure in her father; Hyperlipidemia in her father and mother; Hypertension in her father and mother.  Social History Patient  reports that she has never smoked. She has never used smokeless tobacco. She reports that she does not drink alcohol or use drugs.    Objective: Vitals:   02/25/19 1358  Weight: 200 lb (90.7 kg)  Height: 5\' 3"  (1.6 m)    Body mass index is 35.43 kg/m.  Physical Exam Vitals signs reviewed.  Constitutional:      Appearance: Normal appearance.  HENT:     Head: Normocephalic and atraumatic.  Pulmonary:     Effort: Pulmonary effort is normal.  Neurological:     General: No focal deficit present.     Mental Status: She is alert and oriented to person, place, and time.  Psychiatric:  Mood and Affect: Mood normal.        Behavior: Behavior normal.        Assessment/plan: 1. Abnormal laboratory test Reviewed her lab work with her. Im not overly concerned since she was not fasting and had a normal lipid panel  last year and has made positive lifestyle changes. We will just repeat a fasting lipid panel when she come in for her annual next month. Reassured about her CBC with 3 tenth of a point elevated in her her WBC. No diff for me to see anything else, but likely transient elevation. Will repeat with differential in a month.   2. Elevated ALT Again, this is minimally elevated and likely transient. She does not drink alcohol or take tylenol. I do wonder what is in her drink to shrink and asked if she can get ingredients, I could not find readily over Internet to make sure not source. Will f/u with repeat at annual in a month.     Return if symptoms worsen or fail to improve.   Orma Flaming, MD [place]  02/25/2019

## 2019-05-13 ENCOUNTER — Encounter: Payer: Self-pay | Admitting: Family Medicine

## 2019-05-13 ENCOUNTER — Ambulatory Visit (INDEPENDENT_AMBULATORY_CARE_PROVIDER_SITE_OTHER): Payer: BC Managed Care – PPO | Admitting: Family Medicine

## 2019-05-13 ENCOUNTER — Other Ambulatory Visit: Payer: Self-pay

## 2019-05-13 VITALS — BP 178/96 | HR 117 | Temp 98.3°F | Ht 63.0 in | Wt 209.0 lb

## 2019-05-13 DIAGNOSIS — Z711 Person with feared health complaint in whom no diagnosis is made: Secondary | ICD-10-CM

## 2019-05-13 DIAGNOSIS — M79652 Pain in left thigh: Secondary | ICD-10-CM | POA: Diagnosis not present

## 2019-05-13 DIAGNOSIS — M25572 Pain in left ankle and joints of left foot: Secondary | ICD-10-CM

## 2019-05-13 DIAGNOSIS — R030 Elevated blood-pressure reading, without diagnosis of hypertension: Secondary | ICD-10-CM

## 2019-05-13 NOTE — Patient Instructions (Signed)
Please return in 4-6 weeks with Dr. Rogers Blocker to recheck your blood pressure.  Start breathing exercises daily. Consider yoga or meditation.  Try the [facility] app for sleep stories. They are so soothing.  Take advil twice a day for a week   If you have any questions or concerns, please don't hesitate to send me a message via MyChart or call the office at [phone]. Thank you for visiting with Melanie Hopkins today! It's our pleasure caring for you.    Iliotibial Band Syndrome Rehab Ask your health care provider which exercises are safe for you. Do exercises exactly as told by your health care provider and adjust them as directed. It is normal to feel mild stretching, pulling, tightness, or discomfort as you do these exercises. Stop right away if you feel sudden pain or your pain gets significantly worse. Do not begin these exercises until told by your health care provider. Stretching and range-of-motion exercises These exercises warm up your muscles and joints and improve the movement and flexibility of your hip and pelvis. Quadriceps stretch, prone  1. Lie on your abdomen on a firm surface, such as a bed or padded floor (prone position). 2. Bend your left / right knee and reach back to hold your ankle or pant leg. If you cannot reach your ankle or pant leg, loop a belt around your foot and grab the belt instead. 3. Gently pull your heel toward your buttocks. Your knee should not slide out to the side. You should feel a stretch in the front of your thigh and knee (quadriceps). 4. Hold this position for __________ seconds. Repeat __________ times. Complete this exercise __________ times a day. Iliotibial band stretch An iliotibial band is a strong band of muscle tissue that runs from the outer side of your hip to the outer side of your thigh and knee. 1. Lie on your side with your left / right leg in the top position. 2. Bend both of your knees and grab your left / right ankle. Stretch out your bottom arm to  help you balance. 3. Slowly bring your top knee back so your thigh goes behind your trunk. 4. Slowly lower your top leg toward the floor until you feel a gentle stretch on the outside of your left / right hip and thigh. If you do not feel a stretch and your knee will not fall farther, place the heel of your other foot on top of your knee and pull your knee down toward the floor with your foot. 5. Hold this position for __________ seconds. Repeat __________ times. Complete this exercise __________ times a day. Strengthening exercises These exercises build strength and endurance in your hip and pelvis. Endurance is the ability to use your muscles for a long time, even after they get tired. Straight leg raises, side-lying This exercise strengthens the muscles that rotate the leg at the hip and move it away from your body (hip abductors). 1. Lie on your side with your left / right leg in the top position. Lie so your head, shoulder, hip, and knee line up. You may bend your bottom knee to help you balance. 2. Roll your hips slightly forward so your hips are stacked directly over each other and your left / right knee is facing forward. 3. Tense the muscles in your outer thigh and lift your top leg 4-6 inches (10-15 cm). 4. Hold this position for __________ seconds. 5. Slowly return to the starting position. Let your muscles relax completely before  doing another repetition. Repeat __________ times. Complete this exercise __________ times a day. Leg raises, prone This exercise strengthens the muscles that move the hips (hip extensors). 1. Lie on your abdomen on your bed or a firm surface. You can put a pillow under your hips if that is more comfortable for your lower back. 2. Bend your left / right knee so your foot is straight up in the air. 3. Squeeze your buttocks muscles and lift your left / right thigh off the bed. Do not let your back arch. 4. Tense your thigh muscle as hard as you can without  increasing any knee pain. 5. Hold this position for __________ seconds. 6. Slowly lower your leg to the starting position and allow it to relax completely. Repeat __________ times. Complete this exercise __________ times a day. Hip hike 1. Stand sideways on a bottom step. Stand on your left / right leg with your other foot unsupported next to the step. You can hold on to the railing or wall for balance if needed. 2. Keep your knees straight and your torso square. Then lift your left / right hip up toward the ceiling. 3. Slowly let your left / right hip lower toward the floor, past the starting position. Your foot should get closer to the floor. Do not lean or bend your knees. Repeat __________ times. Complete this exercise __________ times a day. This information is not intended to replace advice given to you by your health care provider. Make sure you discuss any questions you have with your health care provider. Document Revised: 08/07/2018 Document Reviewed: 02/05/2018 Elsevier Patient Education  [place].

## 2019-05-13 NOTE — Progress Notes (Deleted)
Patient: Melanie Hopkins MRN: [id number] DOB: 08/28/1970 PCP: Orma Flaming, MD     Subjective:  No chief complaint on file.   HPI: The patient is a 49 y.o. female who presents today for ***  Review of Systems  Allergies Patient has No Known Allergies.  Past Medical History Patient  has a past medical history of Abnormal uterine bleeding, Anemia, Anxiety, Depression, Elevated liver function tests (2020), Elevated WBC count (2020), Fibroid, Hyperlipidemia, Superficial thrombophlebitis (2005), and SVD (spontaneous vaginal delivery) (10/1991).  Surgical History Patient  has a past surgical history that includes Cesarean section (04/1988); Hernia repair; Oophorectomy (Right); Wisdom tooth extraction; Total laparoscopic hysterectomy with salpingectomy (03/18/2018); Cystoscopy (N/A, 03/18/2018); Laparoscopic lysis of adhesions (03/18/2018); and Perineal laceration repair (03/18/2018).  Family History Pateint's family history includes Congestive Heart Failure in her father; Hyperlipidemia in her father and mother; Hypertension in her father and mother.  Social History Patient  reports that she has never smoked. She has never used smokeless tobacco. She reports that she does not drink alcohol or use drugs.    Objective: There were no vitals filed for this visit.  There is no height or weight on file to calculate BMI.  Physical Exam     Assessment/plan:      No follow-ups on file.     @[facility] @ 05/13/2019

## 2019-05-13 NOTE — Progress Notes (Signed)
Subjective  CC:  Chief Complaint  Patient presents with  . Leg Pain   Same day acute visit; PCP not available. New pt to me. Chart reviewed.   HPI: Melanie Hopkins is a 49 y.o. female who presents to the office today to address the problems listed above in the chief complaint.  Very pleasant married 49 yo female w/ h/o white coat hypertension who is s/p hysterectomy in November presents with mild intermittent pains in her left ankle, upper lateral left thigh and twitching in her left upper arm. Because it is all on the left, she worries it is her heart. She reports intermittent and variable left ankle pain with going up and down stairs. No swelling or injury. Pain doesn't persist. Then had two episodes of soreness of left lateral thigh, noted when she rolled on that side while sleeping last month: resolved with getting out of bed and walking, and then again while walking at the mall a week ago. No injury. Worries about blood clots. No rash, swelling of leg, claudication, sob, cp or joint pain. No new meds. No pain on right. No h/o fibromyalgia. She admits she is extremely worried about her health. Has had a hard year with surgery and covid. Working from home, less active, no exercise and poor sleeper  H/o elevated bp but normalized after surgery; thought related to anxiety.    Assessment  1. Acute left ankle pain   2. Left thigh pain   3. Physically well but worried   4. Elevated blood pressure reading without diagnosis of hypertension      Plan   pain:  Ankle pain and thigh pain seem to be MSK in nature. Suspect tight IT band due to lack of activity; may have mild ankle OA or pain due to pes planus. Reassured. Start stretching exercises at home and trial of advil. No sxs or signs of DVT or CAD/angina or PE.  Anxiety: denies depressive sxs. No h/o panic or anxiety d/o. However, her stress makes her worry about her health. rec meditation and deep breathing exercises. To f/u with PCP to  discuss further if persists.  Need to return to recheck BP when less anxious. She will schedule a visit with Dr. Rogers Blocker for recheck.   Follow up: 2-6 weeks for bp check  Visit date not found  No orders of the defined types were placed in this encounter.  No orders of the defined types were placed in this encounter.     I reviewed the patients updated PMH, FH, and SocHx.    Patient Active Problem List   Diagnosis Date Noted  . Benign essential HTN 02/25/2019  . Status post laparoscopic hysterectomy 03/18/2018   No outpatient medications have been marked as taking for the 05/13/19 encounter (Office Visit) with Leamon Arnt, MD.    Allergies: Patient has No Known Allergies. Family History: Patient family history includes Congestive Heart Failure in her father; Hyperlipidemia in her father and mother; Hypertension in her father and mother. Social History:  Patient  reports that she has never smoked. She has never used smokeless tobacco. She reports that she does not drink alcohol or use drugs.  Review of Systems: Constitutional: Negative for fever malaise or anorexia Cardiovascular: negative for chest pain Respiratory: negative for SOB or persistent cough Gastrointestinal: negative for abdominal pain  Objective  Vitals: BP (!) 178/96 (BP Location: Right Arm, Patient Position: Sitting, Cuff Size: Normal)   Pulse (!) 117   Temp 98.3 F (36.8  C) (Temporal)   Ht 5\' 3"  (1.6 m)   Wt 209 lb (94.8 kg)   LMP 03/09/2018 Comment: Depo Injection 01/2018  SpO2 95%   BMI 37.02 kg/m  General: no acute distress , A&Ox3 Psych: anxious, tearful at times. Fair insight HEENT: PEERL, conjunctiva normal, ,neck is supple Cardiovascular:  RRR without murmur or gallop.  Respiratory:  Good breath sounds bilaterally, CTAB with normal respiratory effort Skin:  Warm, no rashes Left leg: nl ankle exam, nl knee exam. No cords or calf ttp, lateral thigh is ttp. Right thigh is not. Nl gait Left  deltoid: non tender. Nl shoulder exam.      Commons side effects, risks, benefits, and alternatives for medications and treatment plan prescribed today were discussed, and the patient expressed understanding of the given instructions. Patient is instructed to call or message via MyChart if he/she has any questions or concerns regarding our treatment plan. No barriers to understanding were identified. We discussed Red Flag symptoms and signs in detail. Patient expressed understanding regarding what to do in case of urgent or emergency type symptoms.   Medication list was reconciled, printed and provided to the patient in AVS. Patient instructions and summary information was reviewed with the patient as documented in the AVS. This note was prepared with assistance of Dragon voice recognition software. Occasional wrong-word or sound-a-like substitutions may have occurred due to the inherent limitations of voice recognition software  This visit occurred during the SARS-CoV-2 public health emergency.  Safety protocols were in place, including screening questions prior to the visit, additional usage of staff PPE, and extensive cleaning of exam room while observing appropriate contact time as indicated for disinfecting solutions.

## 2019-07-01 ENCOUNTER — Ambulatory Visit: Payer: BC Managed Care – PPO | Admitting: Family Medicine

## 2019-07-09 ENCOUNTER — Other Ambulatory Visit: Payer: Self-pay

## 2019-07-09 ENCOUNTER — Ambulatory Visit (INDEPENDENT_AMBULATORY_CARE_PROVIDER_SITE_OTHER): Payer: BC Managed Care – PPO | Admitting: Family Medicine

## 2019-07-09 ENCOUNTER — Encounter: Payer: Self-pay | Admitting: Family Medicine

## 2019-07-09 VITALS — BP 180/110 | HR 65 | Temp 98.4°F | Ht 63.0 in | Wt 208.0 lb

## 2019-07-09 DIAGNOSIS — F418 Other specified anxiety disorders: Secondary | ICD-10-CM | POA: Diagnosis not present

## 2019-07-09 DIAGNOSIS — I10 Essential (primary) hypertension: Secondary | ICD-10-CM

## 2019-07-09 DIAGNOSIS — R7989 Other specified abnormal findings of blood chemistry: Secondary | ICD-10-CM

## 2019-07-09 LAB — CBC WITH DIFFERENTIAL/PLATELET
Basophils Absolute: 0.1 10*3/uL (ref 0.0–0.1)
Basophils Relative: 0.7 % (ref 0.0–3.0)
Eosinophils Absolute: 0.1 10*3/uL (ref 0.0–0.7)
Eosinophils Relative: 1.5 % (ref 0.0–5.0)
HCT: 41.9 % (ref 36.0–46.0)
Hemoglobin: 14.2 g/dL (ref 12.0–15.0)
Lymphocytes Relative: 34.3 % (ref 12.0–46.0)
Lymphs Abs: 3.3 10*3/uL (ref 0.7–4.0)
MCHC: 33.8 g/dL (ref 30.0–36.0)
MCV: 92.8 fl (ref 78.0–100.0)
Monocytes Absolute: 0.6 10*3/uL (ref 0.1–1.0)
Monocytes Relative: 6.5 % (ref 3.0–12.0)
Neutro Abs: 5.5 10*3/uL (ref 1.4–7.7)
Neutrophils Relative %: 57 % (ref 43.0–77.0)
Platelets: 310 10*3/uL (ref 150.0–400.0)
RBC: 4.51 Mil/uL (ref 3.87–5.11)
RDW: 12.9 % (ref 11.5–15.5)
WBC: 9.7 10*3/uL (ref 4.0–10.5)

## 2019-07-09 LAB — COMPREHENSIVE METABOLIC PANEL
ALT: 29 U/L (ref 0–35)
AST: 21 U/L (ref 0–37)
Albumin: 4.6 g/dL (ref 3.5–5.2)
Alkaline Phosphatase: 59 U/L (ref 39–117)
BUN: 8 mg/dL (ref 6–23)
CO2: 28 mEq/L (ref 19–32)
Calcium: 10.4 mg/dL (ref 8.4–10.5)
Chloride: 100 mEq/L (ref 96–112)
Creatinine, Ser: 0.76 mg/dL (ref 0.40–1.20)
GFR: 97.71 mL/min (ref >60.00)
Glucose, Bld: 99 mg/dL (ref 70–99)
Potassium: 4 mEq/L (ref 3.5–5.1)
Sodium: 137 mEq/L (ref 135–145)
Total Bilirubin: 0.6 mg/dL (ref 0.2–1.2)
Total Protein: 8.1 g/dL (ref 6.0–8.3)

## 2019-07-09 MED ORDER — FLUOXETINE HCL 20 MG PO TABS: 20.0000 mg | ORAL_TABLET | Freq: Every day | ORAL | 3 refills | Status: DC

## 2019-07-09 MED ORDER — HYDROCHLOROTHIAZIDE 25 MG PO TABS: 25.0000 mg | ORAL_TABLET | Freq: Every day | ORAL | 3 refills | Status: DC

## 2019-07-09 NOTE — Patient Instructions (Addendum)
-  for sleep try ashwagandha 300mg  at night about 30 min-1 hour before bed. Can get off [place] or at natural drug store.   Anxiety Starting prozac 20mg /day. Take this in the AM. Will take about 3-4 weeks to really get into your system so dont' expect a miracle over night. Will see you back in 1 month and see if we need to increase this. Let me know if any issues.   -counseling would be good -EXERCISE is the best thing besides counseling for this as well.   Blood pressure -starting back your medication (hctz). Start with 1/2 pill a day. After a week if BP still >140/90 I want you to increase to 25mg /day.   -low salt diet and EXERcise!  -bring your log with you in a month so we can see your blood pressures.    See you back in one month.

## 2019-07-09 NOTE — Progress Notes (Signed)
Patient: Melanie Hopkins MRN: [id number] DOB: Nov 01, 1970 PCP: Orma Flaming, MD     Subjective:  Chief Complaint  Patient presents with  . Hypertension  . Anxiety    HPI: The patient is a 49 y.o. female who presents today for Elevated blood pressure and situational anxiety. She has lost mother's best friend, cousin and aunt in the last week and her dad is not doing well at all. He is 31 years of age.   Hypertension: Here for follow up of hypertension.  Currently on no medication. She has not checked it at home because she doesn't want to be on medication. She used to take hctz back in 2019, but stopped this on her own accord.  Exercise includes none. Weight has been increasing. Normally around 180 pounds.  Denies any chest pain, headaches, shortness of breath, vision changes, swelling in lower extremities. She didn't mind the hctz at all and is open to having this again.    Situational anxiety: in 2019 she had situational anxiety with her surgery. We did hydroxyzine prn. She states right now she doesn't like how she is feeling. She is trying to work on herself. She has been doing her breathing exercises but thinks it is time for a daily medication. She was on lexapro over 20 years ago and did okay, but remembers having medication changed a few times. Denies any si/hi/ah/vh. Supportive husband. Talks to her pastor. No official counseling. Not exercising.   Elevated LFT: due for recheck and will check this today.   Review of Systems  Constitutional: Negative for chills, fatigue and fever.  HENT: Negative for dental problem, ear pain, hearing loss and trouble swallowing.   Eyes: Negative for visual disturbance.  Respiratory: Negative for cough, chest tightness and shortness of breath.   Cardiovascular: Negative for chest pain, palpitations and leg swelling.  Gastrointestinal: Negative for abdominal pain, blood in stool, diarrhea, nausea and vomiting.  Endocrine: Negative for cold  intolerance, polydipsia, polyphagia and polyuria.  Genitourinary: Negative for dysuria and hematuria.  Musculoskeletal: Negative for arthralgias.  Skin: Negative for rash.  Allergic/Immunologic: Negative for food allergies.  Neurological: Negative for dizziness, numbness and headaches.  Psychiatric/Behavioral: Positive for sleep disturbance. Negative for behavioral problems, dysphoric mood and suicidal ideas. The patient is nervous/anxious.     Allergies Patient has No Known Allergies.  Past Medical History Patient  has a past medical history of Abnormal uterine bleeding, Anemia, Anxiety, Depression, Elevated liver function tests (2020), Elevated WBC count (2020), Fibroid, Hyperlipidemia, Superficial thrombophlebitis (2005), and SVD (spontaneous vaginal delivery) (10/1991).  Surgical History Patient  has a past surgical history that includes Cesarean section (04/1988); Hernia repair; Oophorectomy (Right); Wisdom tooth extraction; Total laparoscopic hysterectomy with salpingectomy (03/18/2018); Cystoscopy (N/A, 03/18/2018); Laparoscopic lysis of adhesions (03/18/2018); and Perineal laceration repair (03/18/2018).  Family History Pateint's family history includes Congestive Heart Failure in her father; Hyperlipidemia in her father and mother; Hypertension in her father and mother.  Social History Patient  reports that she has never smoked. She has never used smokeless tobacco. She reports that she does not drink alcohol or use drugs.    Objective: Vitals:   07/09/19 1331 07/09/19 1412  BP: (!) 160/108 (!) 180/110  Pulse: 65   Temp: 98.4 F (36.9 C)   TempSrc: Temporal   SpO2: 99%   Weight: 208 lb (94.3 kg)   Height: 5\' 3"  (1.6 m)     Body mass index is 36.85 kg/m.  Physical Exam Vitals reviewed.  Constitutional:  Appearance: Normal appearance. She is well-developed. She is obese.  HENT:     Head: Normocephalic and atraumatic.     Right Ear: Tympanic membrane, ear canal  and external ear normal.     Left Ear: Tympanic membrane, ear canal and external ear normal.     Nose: Nose normal.     Mouth/Throat:     Mouth: Mucous membranes are moist.  Eyes:     Extraocular Movements: Extraocular movements intact.     Conjunctiva/sclera: Conjunctivae normal.     Pupils: Pupils are equal, round, and reactive to light.  Neck:     Thyroid: No thyromegaly.  Cardiovascular:     Rate and Rhythm: Normal rate and regular rhythm.     Heart sounds: Normal heart sounds. No murmur.  Pulmonary:     Effort: Pulmonary effort is normal.     Breath sounds: Normal breath sounds.  Abdominal:     General: Bowel sounds are normal. There is no distension.     Palpations: Abdomen is soft.     Tenderness: There is no abdominal tenderness.  Musculoskeletal:     Cervical back: Normal range of motion and neck supple.  Lymphadenopathy:     Cervical: No cervical adenopathy.  Skin:    General: Skin is warm and dry.     Findings: No rash.  Neurological:     General: No focal deficit present.     Mental Status: She is alert and oriented to person, place, and time.     Cranial Nerves: No cranial nerve deficit.     Coordination: Coordination normal.     Deep Tendon Reflexes: Reflexes normal.  Psychiatric:        Mood and Affect: Mood normal.        Behavior: Behavior normal.     Comments: Tearful         GAD 7 : Generalized Anxiety Score 07/09/2019 03/10/2018  Nervous, Anxious, on Edge 3 2  Control/stop worrying 3 3  Worry too much - different things 3 3  Trouble relaxing 3 3  Restless 0 1  Easily annoyed or irritable 0 1  Afraid - awful might happen 3 2  Total GAD 7 Score 15 15  Anxiety Difficulty Somewhat difficult Not difficult at all      Office Visit from 07/09/2019 in [facility]  PHQ-9 Total Score  3      Assessment/plan: 1. Benign essential HTN Extremely elevated. Starting back medication. Will go slow at 1/2 pill then increase to full  pill of hctz after one week. Asked that she keep a log for me and bring to next appointment. Discussed low sodium diet, exercise and weight loss and importance of getting this under controlled. She is on board and ready to get this taken care of. Close f/u in one month. Labs today as well.   2. Situational anxiety GAD7 is significantly elevated and affecting her daily life. We are going to start her on daily prozac at 20mg . I've explained to her that drugs of the SSRI class can have side effects such as weight gain, sexual dysfunction, insomnia, headache, nausea. These medications are generally effective at alleviating symptoms of anxiety and/or depression. Let me know if significant side effects do occur. Any si/hi she is ot call 911 or go to ER. Recommended exercise and counseling as well. Close f/u in one month.    3. Elevated LFTs  - CBC with Differential/Platelet - Comprehensive metabolic panel   This  visit occurred during the SARS-CoV-2 public health emergency.  Safety protocols were in place, including screening questions prior to the visit, additional usage of staff PPE, and extensive cleaning of exam room while observing appropriate contact time as indicated for disinfecting solutions.    Return in about 1 month (around 08/09/2019) for blood pressure/anxiety .   Orma Flaming, MD [place]   07/09/2019

## 2019-08-13 ENCOUNTER — Telehealth: Payer: BC Managed Care – PPO | Admitting: Family Medicine

## 2019-08-13 NOTE — Progress Notes (Deleted)
Virtual Visit via Video   Due to the COVID-19 pandemic, this visit was completed with telemedicine (audio/video) technology to reduce patient and provider exposure as well as to preserve personal protective equipment.   I connected with Melanie Hopkins by a video enabled telemedicine application and verified that I am speaking with the correct person using two identifiers. Location patient: Home Location provider: [facility], Office Persons participating in the virtual visit: Melanie Hopkins, Tobe Sos, [place] discussed the limitations of evaluation and management by telemedicine and the availability of in person appointments. The patient expressed understanding and agreed to proceed.  Care Team   Patient Care Team: Orma Flaming, MD as PCP - General (Family Medicine) Nunzio Cobbs, MD as Consulting Physician (Obstetrics and Gynecology)  Subjective:   HPI:   ROS   Patient Active Problem List   Diagnosis Date Noted  . Benign essential HTN 02/25/2019  . Status post laparoscopic hysterectomy 03/18/2018    Social History   Tobacco Use  . Smoking status: Never Smoker  . Smokeless tobacco: Never Used  Substance Use Topics  . Alcohol use: No    Current Outpatient Medications:  .  FLUoxetine (PROZAC) 20 MG tablet, Take 1 tablet (20 mg total) by mouth daily., Disp: 30 tablet, Rfl: 3 .  hydrochlorothiazide (HYDRODIURIL) 25 MG tablet, Take 1 tablet (25 mg total) by mouth daily., Disp: 90 tablet, Rfl: 3  No Known Allergies  Objective:   VITALS: Per patient if applicable, see vitals. GENERAL: Alert, appears well and in no acute distress. HEENT: Atraumatic, conjunctiva clear, no obvious abnormalities on inspection of external nose and ears. NECK: Normal movements of the head and neck. CARDIOPULMONARY: No increased WOB. Speaking in clear sentences. I:E ratio WNL.  MS: Moves all visible extremities without noticeable abnormality. PSYCH: Pleasant and  cooperative, well-groomed. Speech normal rate and rhythm. Affect is appropriate. Insight and judgement are appropriate. Attention is focused, linear, and appropriate.  NEURO: CN grossly intact. Oriented as arrived to appointment on time with no prompting. Moves both UE equally.  SKIN: No obvious lesions, wounds, erythema, or cyanosis noted on face or hands.  Depression screen [facility] 2/9 07/09/2019  Decreased Interest 0  Down, Depressed, Hopeless 0  PHQ - 2 Score 0  Altered sleeping 1  Tired, decreased energy 1  Change in appetite 1  Feeling bad or failure about yourself  0  Trouble concentrating 0  Moving slowly or fidgety/restless 0  Suicidal thoughts 0  PHQ-9 Score 3    Assessment and Plan:   There are no diagnoses linked to this encounter.  Marland Kitchen COVID-19 Education: The signs and symptoms of COVID-19 were discussed with the patient and how to seek care for testing if needed. The importance of social distancing was discussed today. . Reviewed expectations re: course of current medical issues. . Discussed self-management of symptoms. . Outlined signs and symptoms indicating need for more acute intervention. . Patient verbalized understanding and all questions were answered. Marland Kitchen Health Maintenance issues including appropriate healthy diet, exercise, and smoking avoidance were discussed with patient. . See orders for this visit as documented in the electronic medical record.  Tobe Sos, CMA  Records requested if needed. Time spent: *** minutes, of which >50% was spent in obtaining information about her symptoms, reviewing her previous labs, evaluations, and treatments, counseling her about her condition (please see the discussed topics above), and developing a plan to further investigate it; she had a number of  questions which I addressed.

## 2019-10-08 ENCOUNTER — Encounter: Payer: Self-pay | Admitting: Family Medicine

## 2019-11-27 ENCOUNTER — Other Ambulatory Visit: Payer: Self-pay | Admitting: Family Medicine

## 2020-01-05 ENCOUNTER — Other Ambulatory Visit: Payer: Self-pay | Admitting: Family Medicine

## 2020-02-06 ENCOUNTER — Other Ambulatory Visit: Payer: Self-pay | Admitting: Family Medicine

## 2020-02-15 ENCOUNTER — Ambulatory Visit: Payer: BC Managed Care – PPO | Admitting: Obstetrics and Gynecology

## 2020-02-15 ENCOUNTER — Ambulatory Visit: Payer: Self-pay | Admitting: Obstetrics and Gynecology

## 2020-02-15 ENCOUNTER — Encounter: Payer: Self-pay | Admitting: Obstetrics and Gynecology

## 2020-02-15 NOTE — Progress Notes (Deleted)
49 y.o. [id number] Married [place] American female here for annual exam.    PCP:     Patient's last menstrual period was 03/09/2018.           Sexually active: {yes no:314532}  The current method of family planning is status post hysterectomy.    Exercising: {yes no:314532}  {types:19826} Smoker:  no  Health Maintenance: Pap:  01-13-18 Neg:Neg HR HPV History of abnormal Pap:  Yes, a few years ago MMG:  ***03-03-18 Neg/density B/BiRads1 Colonoscopy:  ***none BMD:   n/a  Result  n/a TDaP:  *** Gardasil:   no HIV: Neg in the past Hep C: Neg in the past Screening Labs:  Hb today: ***, Urine today: ***   reports that she has never smoked. She has never used smokeless tobacco. She reports that she does not drink alcohol and does not use drugs.  Past Medical History:  Diagnosis Date  . Abnormal uterine bleeding   . Anemia   . Anxiety    no meds  . Depression    no meds  . Elevated liver function tests 2020  . Elevated WBC count 2020  . Fibroid   . Hyperlipidemia    no med, diet controlled  . Superficial thrombophlebitis 2005   One episode.  Treated with ASA.  . SVD (spontaneous vaginal delivery) 10/1991   x 1    Past Surgical History:  Procedure Laterality Date  . CESAREAN SECTION  04/1988   x 1  . CYSTOSCOPY N/A 03/18/2018   Procedure: CYSTOSCOPY;  Surgeon: Nunzio Cobbs, MD;  Location: [facility];  Service: Gynecology;  Laterality: N/A;  . HERNIA REPAIR     supraumbilical herniorrhaphy after birth of second child.  [facility] in [place].   No mesh per patient.  Marland Kitchen LAPAROSCOPIC LYSIS OF ADHESIONS  03/18/2018   Procedure: LAPAROSCOPIC LYSIS OF ADHESIONS;  Surgeon: Nunzio Cobbs, MD;  Location: [facility];  Service: Gynecology;;  . OOPHORECTOMY Right   . PERINEAL LACERATION REPAIR  03/18/2018   Procedure: REPAIR PERINEAL LACERATION;  Surgeon: Nunzio Cobbs, MD;  Location: [facility];  Service: Gynecology;;  . TOTAL LAPAROSCOPIC HYSTERECTOMY  WITH SALPINGECTOMY  03/18/2018   Procedure: TOTAL LAPAROSCOPIC HYSTERECTOMY WITH BILATERAL SALPINGECTOMY WITH VAGINAL MORCELLATION;  Surgeon: Nunzio Cobbs, MD;  Location: [facility];  Service: Gynecology;;  please have all sizes of alexis morcellation bag in the room un-opened  . WISDOM TOOTH EXTRACTION      Current Outpatient Medications  Medication Sig Dispense Refill  . FLUoxetine (PROZAC) 20 MG tablet TAKE 1 TABLET BY MOUTH DAILY. PLEASE SCHEDULE APPOINTMENT FOR FURTHER REFILLS [phone] 30 tablet 0  . hydrochlorothiazide (HYDRODIURIL) 25 MG tablet Take 1 tablet (25 mg total) by mouth daily. 90 tablet 3   No current facility-administered medications for this visit.    Family History  Problem Relation Age of Onset  . Hypertension Mother   . Hyperlipidemia Mother   . Hypertension Father   . Hyperlipidemia Father   . Congestive Heart Failure Father     Review of Systems  Exam:   LMP 03/09/2018 Comment: Depo Injection 01/2018    General appearance: alert, cooperative and appears stated age Head: normocephalic, without obvious abnormality, atraumatic Neck: no adenopathy, supple, symmetrical, trachea midline and thyroid normal to inspection and palpation Lungs: clear to auscultation bilaterally Breasts: normal appearance, no masses or tenderness, No nipple retraction or dimpling, No nipple discharge or bleeding, No axillary adenopathy  Heart: regular rate and rhythm Abdomen: soft, non-tender; no masses, no organomegaly Extremities: extremities normal, atraumatic, no cyanosis or edema Skin: skin color, texture, turgor normal. No rashes or lesions Lymph nodes: cervical, supraclavicular, and axillary nodes normal. Neurologic: grossly normal  Pelvic: External genitalia:  no lesions              No abnormal inguinal nodes palpated.              Urethra:  normal appearing urethra with no masses, tenderness or lesions              Bartholins and Skenes: normal                  Vagina: normal appearing vagina with normal color and discharge, no lesions              Cervix: no lesions              Pap taken: {yes no:314532} Bimanual Exam:  Uterus:  normal size, contour, position, consistency, mobility, non-tender              Adnexa: no mass, fullness, tenderness              Rectal exam: {yes no:314532}.  Confirms.              Anus:  normal sphincter tone, no lesions  Chaperone was present for exam.  Assessment:   Well woman visit with normal exam.   Plan: Mammogram screening discussed. Self breast awareness reviewed. Pap and HR HPV as above. Guidelines for Calcium, Vitamin D, regular exercise program including cardiovascular and weight bearing exercise.   Follow up annually and prn.   Additional counseling given.  {yes [id number]. _______ minutes face to face time of which over 50% was spent in counseling.    After visit summary provided.

## 2020-02-22 ENCOUNTER — Other Ambulatory Visit: Payer: Self-pay | Admitting: Family Medicine

## 2020-04-21 ENCOUNTER — Other Ambulatory Visit: Payer: Self-pay | Admitting: Family Medicine

## 2020-04-21 MED ORDER — FLUOXETINE HCL 20 MG PO CAPS: 20.0000 mg | ORAL_CAPSULE | Freq: Every day | ORAL | 1 refills | Status: DC

## 2020-06-18 ENCOUNTER — Other Ambulatory Visit: Payer: Self-pay | Admitting: Family Medicine

## 2020-08-25 ENCOUNTER — Other Ambulatory Visit: Payer: Self-pay | Admitting: Family Medicine

## 2020-10-25 ENCOUNTER — Other Ambulatory Visit: Payer: Self-pay | Admitting: Family Medicine

## 2020-10-26 NOTE — Telephone Encounter (Signed)
Please call pt and schedule appt with new provider so Rx can be filled.

## 2020-10-26 NOTE — Telephone Encounter (Signed)
LVM for patient to call back and schedule TOC

## 2021-03-02 ENCOUNTER — Encounter (INDEPENDENT_AMBULATORY_CARE_PROVIDER_SITE_OTHER): Payer: Self-pay | Admitting: *Deleted

## 2021-03-02 ENCOUNTER — Other Ambulatory Visit: Payer: Self-pay | Admitting: Family Medicine

## 2021-03-02 DIAGNOSIS — Z1231 Encounter for screening mammogram for malignant neoplasm of breast: Secondary | ICD-10-CM

## 2021-03-14 ENCOUNTER — Telehealth: Payer: Self-pay

## 2021-03-14 NOTE — Telephone Encounter (Signed)
error 

## 2021-04-04 ENCOUNTER — Ambulatory Visit
Admission: RE | Admit: 2021-04-04 | Discharge: 2021-04-04 | Disposition: A | Discharge status: Home / Self Care | Payer: BC Managed Care – PPO | Source: Ambulatory Visit | Attending: Family Medicine | Admitting: Family Medicine

## 2021-04-04 ENCOUNTER — Other Ambulatory Visit: Payer: Self-pay

## 2021-04-04 DIAGNOSIS — Z1231 Encounter for screening mammogram for malignant neoplasm of breast: Secondary | ICD-10-CM

## 2021-08-28 ENCOUNTER — Encounter (INDEPENDENT_AMBULATORY_CARE_PROVIDER_SITE_OTHER): Payer: Self-pay | Admitting: *Deleted

## 2021-11-07 NOTE — Progress Notes (Deleted)
GYNECOLOGY  VISIT   HPI: 51 y.o.   Married  [place] American  female   [phone] with Patient's last menstrual period was 02/26/2018 (exact date).   here to discuss HRT.    GYNECOLOGIC HISTORY: Patient's last menstrual period was 02/26/2018 (exact date). Contraception:  Hyst Menopausal hormone therapy:  none Last mammogram:  04-04-21 Neg/birads1 Last pap smear: 01-13-18 Neg:Neg HR HPV        OB History     Gravida  3   Para  2   Term  2   Preterm  0   AB  1   Living  2      SAB  0   IAB  0   Ectopic  0   Multiple  0   Live Births  2              Patient Active Problem List   Diagnosis Date Noted   Benign essential HTN 02/25/2019   Status post laparoscopic hysterectomy 03/18/2018    Past Medical History:  Diagnosis Date   Abnormal uterine bleeding    Anemia    Anxiety    no meds   Depression    no meds   Elevated liver function tests 2020   Elevated WBC count 2020   Fibroid    Hyperlipidemia    no med, diet controlled   Superficial thrombophlebitis 2005   One episode.  Treated with ASA.   SVD (spontaneous vaginal delivery) 10/1991   x 1    Past Surgical History:  Procedure Laterality Date   CESAREAN SECTION  04/1988   x 1   CYSTOSCOPY N/A 03/18/2018   Procedure: CYSTOSCOPY;  Surgeon: Nunzio Cobbs, MD;  Location: [facility];  Service: Gynecology;  Laterality: N/A;   HERNIA REPAIR     supraumbilical herniorrhaphy after birth of second child.  [facility] in [place].   No mesh per patient.   LAPAROSCOPIC LYSIS OF ADHESIONS  03/18/2018   Procedure: LAPAROSCOPIC LYSIS OF ADHESIONS;  Surgeon: Nunzio Cobbs, MD;  Location: [facility];  Service: Gynecology;;   OOPHORECTOMY Right    PERINEAL LACERATION REPAIR  03/18/2018   Procedure: REPAIR PERINEAL LACERATION;  Surgeon: Nunzio Cobbs, MD;  Location: [facility];  Service: Gynecology;;   TOTAL LAPAROSCOPIC HYSTERECTOMY WITH SALPINGECTOMY  03/18/2018   Procedure: TOTAL  LAPAROSCOPIC HYSTERECTOMY WITH BILATERAL SALPINGECTOMY WITH VAGINAL MORCELLATION;  Surgeon: Nunzio Cobbs, MD;  Location: [facility];  Service: Gynecology;;  please have all sizes of alexis morcellation bag in the room un-opened   WISDOM TOOTH EXTRACTION      Current Outpatient Medications  Medication Sig Dispense Refill   FLUoxetine (PROZAC) 20 MG capsule TAKE 1 CAPSULE BY MOUTH EVERY DAY *NEED FOLLOW UP* 30 capsule 1   hydrochlorothiazide (HYDRODIURIL) 25 MG tablet Take 1 tablet (25 mg total) by mouth daily. 90 tablet 3   No current facility-administered medications for this visit.     ALLERGIES: Patient has no known allergies.  Family History  Problem Relation Age of Onset   Hypertension Mother    Hyperlipidemia Mother    Hypertension Father    Hyperlipidemia Father    Congestive Heart Failure Father     Social History   Socioeconomic History   Marital status: Married    Spouse name: Doren Custard   Number of children: 2   Years of education: Not on file   Highest education level: Not on file  Occupational History   Not on file  Tobacco Use   Smoking status: Never   Smokeless tobacco: Never  Vaping Use   Vaping Use: Never used  Substance and Sexual Activity   Alcohol use: No   Drug use: No   Sexual activity: Yes    Partners: Male    Birth control/protection: Other-see comments, Surgical    Comment: husband with vasectomy,Hysterectomy  Other Topics Concern   Not on file  Social History Narrative   Not on file   Social Determinants of Health   Financial Resource Strain: Not on file  Food Insecurity: Not on file  Transportation Needs: Not on file  Physical Activity: Not on file  Stress: Not on file  Social Connections: Not on file  Intimate Partner Violence: Not on file    Review of Systems  PHYSICAL EXAMINATION:    LMP 02/26/2018 (Exact Date) Comment: Depo Injection 01/2018    General appearance: alert, cooperative and appears stated age Head:  Normocephalic, without obvious abnormality, atraumatic Neck: no adenopathy, supple, symmetrical, trachea midline and thyroid normal to inspection and palpation Lungs: clear to auscultation bilaterally Breasts: normal appearance, no masses or tenderness, No nipple retraction or dimpling, No nipple discharge or bleeding, No axillary or supraclavicular adenopathy Heart: regular rate and rhythm Abdomen: soft, non-tender, no masses,  no organomegaly Extremities: extremities normal, atraumatic, no cyanosis or edema Skin: Skin color, texture, turgor normal. No rashes or lesions Lymph nodes: Cervical, supraclavicular, and axillary nodes normal. No abnormal inguinal nodes palpated Neurologic: Grossly normal  Pelvic: External genitalia:  no lesions              Urethra:  normal appearing urethra with no masses, tenderness or lesions              Bartholins and Skenes: normal                 Vagina: normal appearing vagina with normal color and discharge, no lesions              Cervix: no lesions                Bimanual Exam:  Uterus:  normal size, contour, position, consistency, mobility, non-tender              Adnexa: no mass, fullness, tenderness              Rectal exam: {yes no:314532}.  Confirms.              Anus:  normal sphincter tone, no lesions  Chaperone was present for exam:  ***  ASSESSMENT     PLAN     An After Visit Summary was printed and given to the patient.  ______ minutes face to face time of which over 50% was spent in counseling.

## 2021-11-08 ENCOUNTER — Ambulatory Visit: Payer: Self-pay | Admitting: Obstetrics and Gynecology

## 2021-11-14 NOTE — Progress Notes (Signed)
GYNECOLOGY  VISIT   HPI: 51 y.o.   Married  [place] American  female   [phone] with Patient's last menstrual period was 02/26/2018 (exact date).   here for to discuss HRT. Patient complaining of hot flashes, weight gain and left hip ache. Denies vaginal dryness. No painful intercourse. Has decreased libido.  Treatimg joint pain and weight loss are her main goals.  Her left hip is having pain and left thigh pain in the muscle/tendon area.  She has had normal x-rays with her orthopedist.  She was offered steroid injection for bursitis.   PCP gave her Wegovy and lost 4 pounds.  Stopped Wegovy and her weight increased.   Patient and husband had a temporary separation.   Patient is thinking of working less.   Father passed away from cancer.  Patient took care of him.   Patient started Prozac after her father passed and has been off now for 2 years.  Used Effexor in the past when her husband died.  She did have some weight loss on this medication 20 years ago.   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  GYNECOLOGIC HISTORY: Patient's last menstrual period was 02/26/2018 (exact date). Contraception:  Hyst Menopausal hormone therapy:  none Last mammogram:    04-04-21 Neg/birads1 Last pap smear: 01-13-18 Neg:Neg HR HPV               OB History     Gravida  3   Para  2   Term  2   Preterm  0   AB  1   Living  2      SAB  0   IAB  0   Ectopic  0   Multiple  0   Live Births  2              Patient Active Problem List   Diagnosis Date Noted   Benign essential HTN 02/25/2019   Status post laparoscopic hysterectomy 03/18/2018    Past Medical History:  Diagnosis Date   Abnormal uterine bleeding    Anemia    Anxiety    no meds   Depression    no meds   Elevated liver function tests 2020   Elevated WBC count 2020   Fibroid    Hyperlipidemia    no med, diet controlled   Superficial thrombophlebitis 2005   One episode.  Treated with ASA.    SVD (spontaneous vaginal delivery) 10/1991   x 1    Past Surgical History:  Procedure Laterality Date   CESAREAN SECTION  04/1988   x 1   CYSTOSCOPY N/A 03/18/2018   Procedure: CYSTOSCOPY;  Surgeon: Nunzio Cobbs, MD;  Location: [facility];  Service: Gynecology;  Laterality: N/A;   HERNIA REPAIR     supraumbilical herniorrhaphy after birth of second child.  [facility] [place].   No mesh per patient.   LAPAROSCOPIC LYSIS OF ADHESIONS  03/18/2018   Procedure: LAPAROSCOPIC LYSIS OF ADHESIONS;  Surgeon: Nunzio Cobbs, MD;  Location: [facility];  Service: Gynecology;;   OOPHORECTOMY Right    PERINEAL LACERATION REPAIR  03/18/2018   Procedure: REPAIR PERINEAL LACERATION;  Surgeon: Nunzio Cobbs, MD;  Location: [facility];  Service: Gynecology;;   TOTAL LAPAROSCOPIC HYSTERECTOMY WITH SALPINGECTOMY  03/18/2018   Procedure: TOTAL LAPAROSCOPIC HYSTERECTOMY WITH BILATERAL SALPINGECTOMY WITH VAGINAL MORCELLATION;  Surgeon: Nunzio Cobbs, MD;  Location: [facility];  Service: Gynecology;;  please have all sizes of alexis morcellation bag in the room un-opened   WISDOM TOOTH EXTRACTION      Current Outpatient Medications  Medication Sig Dispense Refill   atorvastatin (LIPITOR) 20 MG tablet Take 20 mg by mouth daily.     losartan-hydrochlorothiazide (HYZAAR) 50-12.5 MG tablet Take 1 tablet by mouth daily.     No current facility-administered medications for this visit.     ALLERGIES: Patient has no known allergies.  Family History  Problem Relation Age of Onset   Hypertension Mother    Hyperlipidemia Mother    Hypertension Father    Hyperlipidemia Father    Congestive Heart Failure Father     Social History   Socioeconomic History   Marital status: Married    Spouse name: Doren Custard   Number of children: 2   Years of education: Not on file   Highest education level: Not on file  Occupational History   Not on file  Tobacco Use   Smoking status:  Never   Smokeless tobacco: Never  Vaping Use   Vaping Use: Never used  Substance and Sexual Activity   Alcohol use: No   Drug use: No   Sexual activity: Yes    Partners: Male    Birth control/protection: Other-see comments, Surgical    Comment: husband with vasectomy,Hysterectomy  Other Topics Concern   Not on file  Social History Narrative   Not on file   Social Determinants of Health   Financial Resource Strain: Not on file  Food Insecurity: Not on file  Transportation Needs: Not on file  Physical Activity: Not on file  Stress: Not on file  Social Connections: Not on file  Intimate Partner Violence: Not on file    Review of Systems  Constitutional:  Positive for unexpected weight change (weight gain).  Genitourinary:        Hot flashes, decreased libido  Musculoskeletal:  Positive for joint swelling (joint aches).  All other systems reviewed and are negative.   PHYSICAL EXAMINATION:    BP 130/80   Pulse 69   Ht '5\' 2"'$  (1.575 m)   Wt 205 lb (93 kg)   LMP 02/26/2018 (Exact Date) Comment: Depo Injection 01/2018  SpO2 98%   BMI 37.49 kg/m     General appearance: alert, cooperative and appears stated age  ASSESSMENT  Status post total laparoscopic hysterectomy with bilateral salpingectomy, lysis of adhesions, vaginal morcellation of the uterus and cervix in an Alexis bag, repair of perineal laceration, cystoscopy 2019.   Status post prior right oophorectomy.   Has left ovary remaining.   Menopausal symptoms.   Decreased libido.   Hx superficial thrombophlebitis.   Left hip pain.   PLAN  We discussed menopause signs and symptoms.   Her current symptoms may be multifactorial and related to menopause, marital change, joint issues, and job/life stress.   We discussed estrogen therapy, testosterone treatment, Paxil, Effexor, and Neurontin treatment of menopausal symptoms as well as life stress. Risks and benefits reviewed.    Brochure on menopause stages  to patient.   Return for annual exam.  Will check FSH, estradiol, and testosterone at annual exam.    An After Visit Summary was printed and given to the patient.  35 min  total time was spent for this patient encounter, including preparation, face-to-face counseling with the patient, coordination of care, and documentation of the encounter.

## 2021-11-22 ENCOUNTER — Encounter: Payer: Self-pay | Admitting: Obstetrics and Gynecology

## 2021-11-22 ENCOUNTER — Ambulatory Visit (INDEPENDENT_AMBULATORY_CARE_PROVIDER_SITE_OTHER): Payer: BC Managed Care – PPO | Admitting: Obstetrics and Gynecology

## 2021-11-22 VITALS — BP 130/80 | HR 69 | Ht 62.0 in | Wt 205.0 lb

## 2021-11-22 DIAGNOSIS — M25552 Pain in left hip: Secondary | ICD-10-CM

## 2021-11-22 DIAGNOSIS — R6882 Decreased libido: Secondary | ICD-10-CM

## 2021-11-22 DIAGNOSIS — N951 Menopausal and female climacteric states: Secondary | ICD-10-CM | POA: Diagnosis not present

## 2021-12-07 ENCOUNTER — Ambulatory Visit: Payer: Self-pay | Admitting: Obstetrics and Gynecology

## 2022-03-28 NOTE — Progress Notes (Signed)
51 y.o. [id number] Married [place] American female here for annual exam.    Working part-time and feeling better.   Less night sweats.  Not taking any OTC treatments for this.  Improved libido.  Wants blood work to check her hormones.  Dealing with insomnia, which is long term.   She took 30 days of steroids for hip pain.  Did physical therapy.   PCP:   Francis Gaines, FNP  Patient's last menstrual period was 02/26/2018 (exact date).           Sexually active: Yes.    The current method of family planning is status post hysterectomy.    Exercising: No.   Smoker:  no  Health Maintenance: Pap: 01/13/18 Neg:Neg HR HPV  History of abnormal Pap:  yes, years ago  MMG:  04/04/21, Breast Density Category B, BI-RADS CATEGORY 1 Negative Colonoscopy:  n/a, supposed to be done per pt.  BMD:   n/a  Result  n/a TDaP:  within 10 years Gardasil:   no Screening Labs:  PCP Flu vaccine:  Declined   reports that she has never smoked. She has never used smokeless tobacco. She reports that she does not drink alcohol and does not use drugs.  Past Medical History:  Diagnosis Date   Abnormal uterine bleeding    Anemia    Anxiety    no meds   Depression    no meds   Elevated liver function tests 2020   Elevated WBC count 2020   Fibroid    Hyperlipidemia    no med, diet controlled   Superficial thrombophlebitis 2005   One episode.  Treated with ASA.   SVD (spontaneous vaginal delivery) 10/1991   x 1    Past Surgical History:  Procedure Laterality Date   CESAREAN SECTION  04/1988   x 1   CYSTOSCOPY N/A 03/18/2018   Procedure: CYSTOSCOPY;  Surgeon: Nunzio Cobbs, MD;  Location: [facility];  Service: Gynecology;  Laterality: N/A;   HERNIA REPAIR     supraumbilical herniorrhaphy after birth of second child.  [facility] in [place].   No mesh per patient.   LAPAROSCOPIC LYSIS OF ADHESIONS  03/18/2018   Procedure: LAPAROSCOPIC LYSIS OF ADHESIONS;  Surgeon: Nunzio Cobbs, MD;  Location: [facility];  Service: Gynecology;;   OOPHORECTOMY Right    PERINEAL LACERATION REPAIR  03/18/2018   Procedure: REPAIR PERINEAL LACERATION;  Surgeon: Nunzio Cobbs, MD;  Location: [facility];  Service: Gynecology;;   TOTAL LAPAROSCOPIC HYSTERECTOMY WITH SALPINGECTOMY  03/18/2018   Procedure: TOTAL LAPAROSCOPIC HYSTERECTOMY WITH BILATERAL SALPINGECTOMY WITH VAGINAL MORCELLATION;  Surgeon: Nunzio Cobbs, MD;  Location: [facility];  Service: Gynecology;;  please have all sizes of alexis morcellation bag in the room un-opened   WISDOM TOOTH EXTRACTION      Current Outpatient Medications  Medication Sig Dispense Refill   atorvastatin (LIPITOR) 20 MG tablet Take 20 mg by mouth daily.     losartan-hydrochlorothiazide (HYZAAR) 50-12.5 MG tablet Take 1 tablet by mouth daily.     No current facility-administered medications for this visit.    Family History  Problem Relation Age of Onset   Hypertension Mother    Hyperlipidemia Mother    Hypertension Father    Hyperlipidemia Father    Congestive Heart Failure Father     Review of Systems  All other systems reviewed and are negative.   Exam:   BP 121/81 (BP Location: Left  Arm, Patient Position: Sitting, Cuff Size: Normal)   Ht '5\' 2"'$  (1.575 m)   Wt 206 lb (93.4 kg)   LMP 02/26/2018 (Exact Date) Comment: Depo Injection 01/2018  BMI 37.68 kg/m     General appearance: alert, cooperative and appears stated age Head: normocephalic, without obvious abnormality, atraumatic Neck: no adenopathy, supple, symmetrical, trachea midline and thyroid normal to inspection and palpation Lungs: clear to auscultation bilaterally Breasts: normal appearance, no masses or tenderness, No nipple retraction or dimpling, No nipple discharge or bleeding, No axillary adenopathy Heart: regular rate and rhythm Abdomen: soft, non-tender; no masses, no organomegaly Extremities: extremities normal, atraumatic, no cyanosis or  edema Skin: skin color, texture, turgor normal. No rashes or lesions Lymph nodes: cervical, supraclavicular, and axillary nodes normal. Neurologic: grossly normal  Pelvic: External genitalia:  no lesions              No abnormal inguinal nodes palpated.              Urethra:  normal appearing urethra with no masses, tenderness or lesions              Bartholins and Skenes: normal                 Vagina: normal appearing vagina with normal color and discharge, no lesions              Cervix:absent              Pap taken: no Bimanual Exam:  Uterus: absen              Adnexa: no mass, fullness, tenderness              Rectal exam: yes.  Confirms.              Anus:  normal sphincter tone, no lesions  Chaperone was present for exam:  Raquel Sarna  Assessment:   Well woman visit with gynecologic exam. Status post total laparoscopic hysterectomy with bilateral salpingectomy, lysis of adhesions, vaginal morcellation of the uterus and cervix in an Alexis bag, repair of perineal laceration, cystoscopy 2019.  Status post prior right oophorectomy.  Has left ovary remaining.  Menopausal symptoms.  Decreased libido.  Hx superficial thrombophlebitis.  Dysuria. Colon cancer screening.   Plan: Mammogram screening discussed. Self breast awareness reviewed. Pap and HR HPV as above. Guidelines for Calcium, Vitamin D, regular exercise program including cardiovascular and weight bearing exercise. Urinalysis and reflex culture. FSH, estradiol, testosterone. Referral for colonoscopy.  Follow up annually and prn.   After visit summary provided.

## 2022-03-29 ENCOUNTER — Ambulatory Visit: Payer: BC Managed Care – PPO | Admitting: Obstetrics and Gynecology

## 2022-04-04 ENCOUNTER — Other Ambulatory Visit: Payer: Self-pay | Admitting: Obstetrics and Gynecology

## 2022-04-04 ENCOUNTER — Encounter: Payer: Self-pay | Admitting: Obstetrics and Gynecology

## 2022-04-04 ENCOUNTER — Ambulatory Visit (INDEPENDENT_AMBULATORY_CARE_PROVIDER_SITE_OTHER): Payer: BC Managed Care – PPO | Admitting: Obstetrics and Gynecology

## 2022-04-04 VITALS — BP 121/81 | Ht 62.0 in | Wt 206.0 lb

## 2022-04-04 DIAGNOSIS — N951 Menopausal and female climacteric states: Secondary | ICD-10-CM

## 2022-04-04 DIAGNOSIS — Z01419 Encounter for gynecological examination (general) (routine) without abnormal findings: Secondary | ICD-10-CM | POA: Diagnosis not present

## 2022-04-04 DIAGNOSIS — Z1211 Encounter for screening for malignant neoplasm of colon: Secondary | ICD-10-CM

## 2022-04-04 DIAGNOSIS — R6882 Decreased libido: Secondary | ICD-10-CM

## 2022-04-04 DIAGNOSIS — R300 Dysuria: Secondary | ICD-10-CM | POA: Diagnosis not present

## 2022-04-04 DIAGNOSIS — Z1231 Encounter for screening mammogram for malignant neoplasm of breast: Secondary | ICD-10-CM

## 2022-04-04 LAB — URINALYSIS, COMPLETE W/RFL CULTURE
Bacteria, UA: NONE SEEN /HPF
Bilirubin Urine: NEGATIVE
Casts: NONE SEEN /LPF
Crystals: NONE SEEN /HPF
Glucose, UA: NEGATIVE
Hgb urine dipstick: NEGATIVE
Hyaline Cast: NONE SEEN /LPF
Ketones, ur: NEGATIVE
Leukocyte Esterase: NEGATIVE
Nitrites, Initial: NEGATIVE
Protein, ur: NEGATIVE
RBC / HPF: NONE SEEN /HPF (ref 0–2)
Specific Gravity, Urine: 1.015 (ref 1.001–1.035)
WBC, UA: NONE SEEN /HPF (ref 0–5)
Yeast: NONE SEEN /HPF
pH: 7.5 (ref 5.0–8.0)

## 2022-04-04 LAB — NO CULTURE INDICATED

## 2022-04-04 NOTE — Patient Instructions (Signed)

## 2022-04-09 LAB — TESTOS,TOTAL,FREE AND SHBG (FEMALE)
Free Testosterone: 2 pg/mL (ref 0.1–6.4)
Sex Hormone Binding: 30.9 nmol/L (ref 17–124)
Testosterone, Total, LC-MS-MS: 15 ng/dL (ref 2–45)

## 2022-04-09 LAB — FOLLICLE STIMULATING HORMONE: FSH: 52 m[IU]/mL

## 2022-04-09 LAB — ESTRADIOL: Estradiol: 70 pg/mL

## 2022-05-07 ENCOUNTER — Encounter: Payer: Self-pay | Admitting: Gastroenterology

## 2022-05-30 ENCOUNTER — Ambulatory Visit
Admission: RE | Admit: 2022-05-30 | Discharge: 2022-05-30 | Disposition: A | Discharge status: Home / Self Care | Payer: BC Managed Care – PPO | Source: Ambulatory Visit | Attending: Obstetrics and Gynecology | Admitting: Obstetrics and Gynecology

## 2022-05-30 DIAGNOSIS — Z1231 Encounter for screening mammogram for malignant neoplasm of breast: Secondary | ICD-10-CM

## 2022-06-11 IMAGING — MG MM DIGITAL SCREENING BILAT W/ TOMO AND CAD
8 series · 8 of 24 positions shown · non-contrast
Comparison: Previous exam(s).

CLINICAL DATA: Screening.

EXAM:
DIGITAL SCREENING BILATERAL MAMMOGRAM WITH TOMOSYNTHESIS AND CAD
TECHNIQUE: Bilateral screening digital craniocaudal and mediolateral oblique
mammograms were obtained. Bilateral screening digital breast
tomosynthesis was performed. The images were evaluated with
computer-aided detection.

[L CC synth-2D]
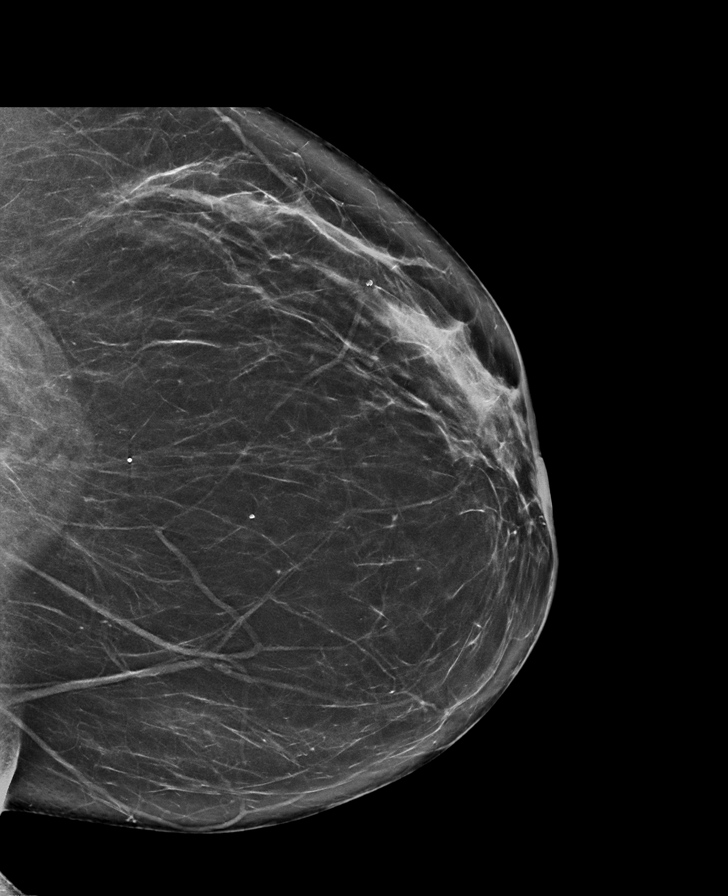

[R MLO synth-2D]
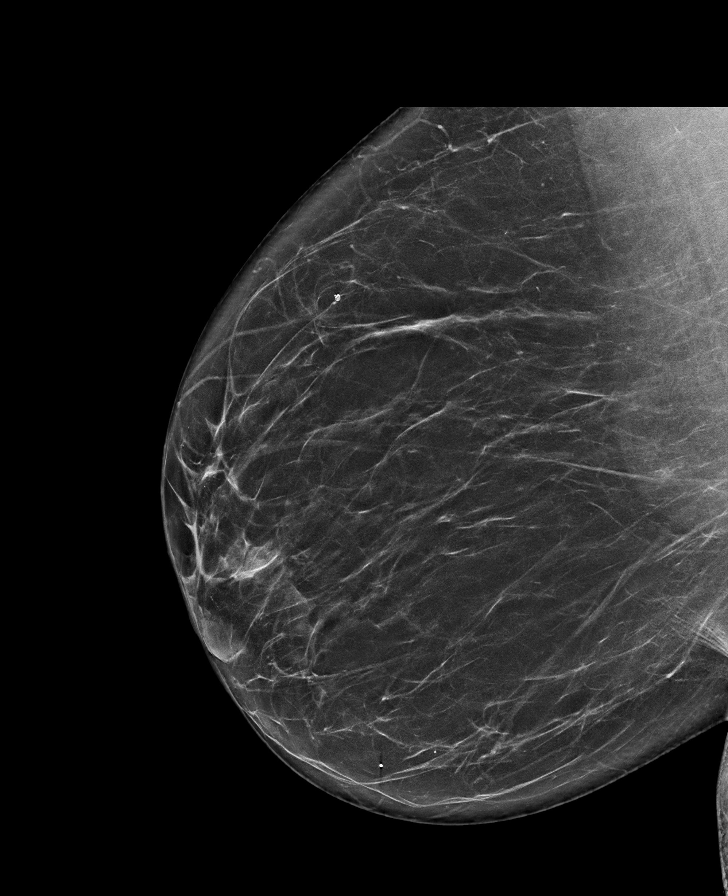

[R CC synth-2D]
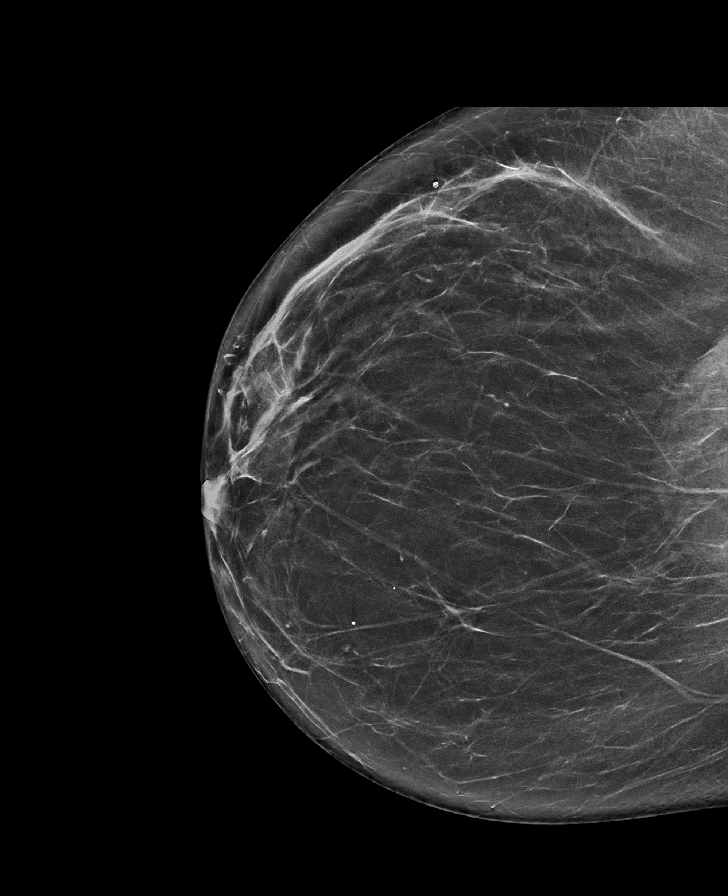

[L MLO synth-2D]
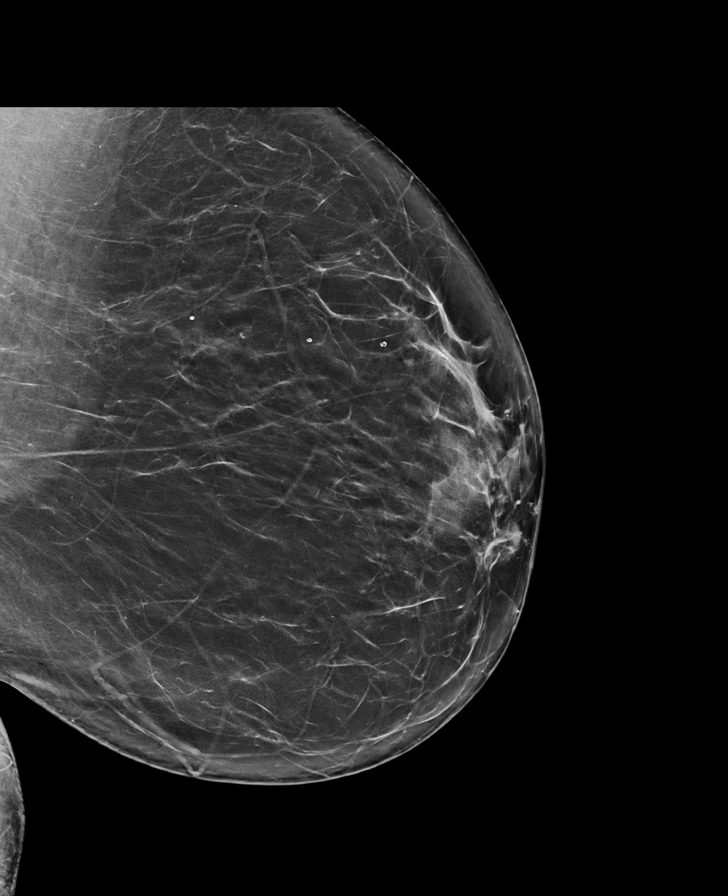

[R CC tomo · tomo slice 38/75.0]
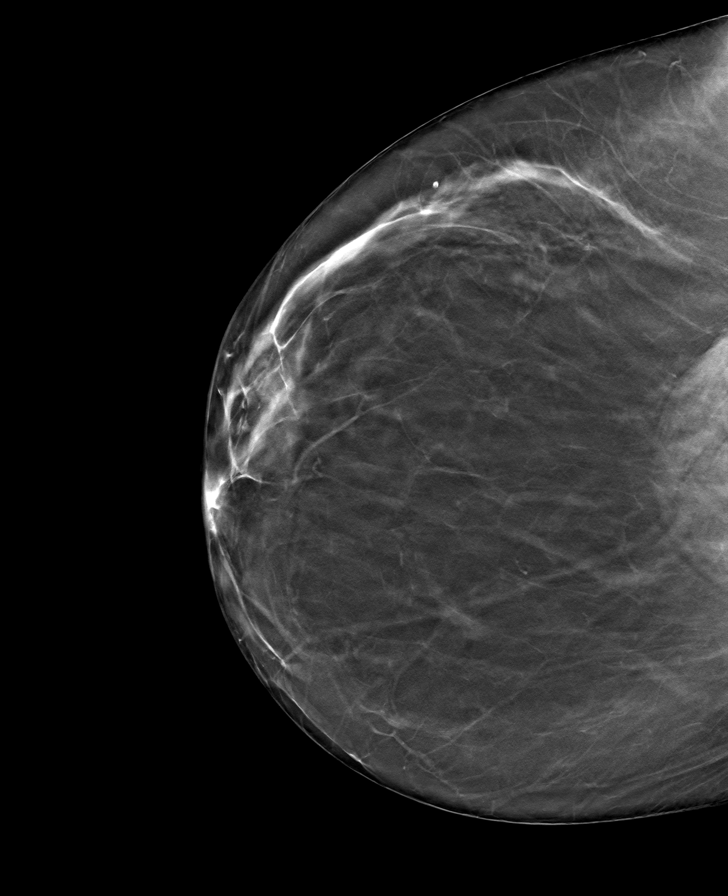

[R MLO tomo · tomo slice 43/86.0]
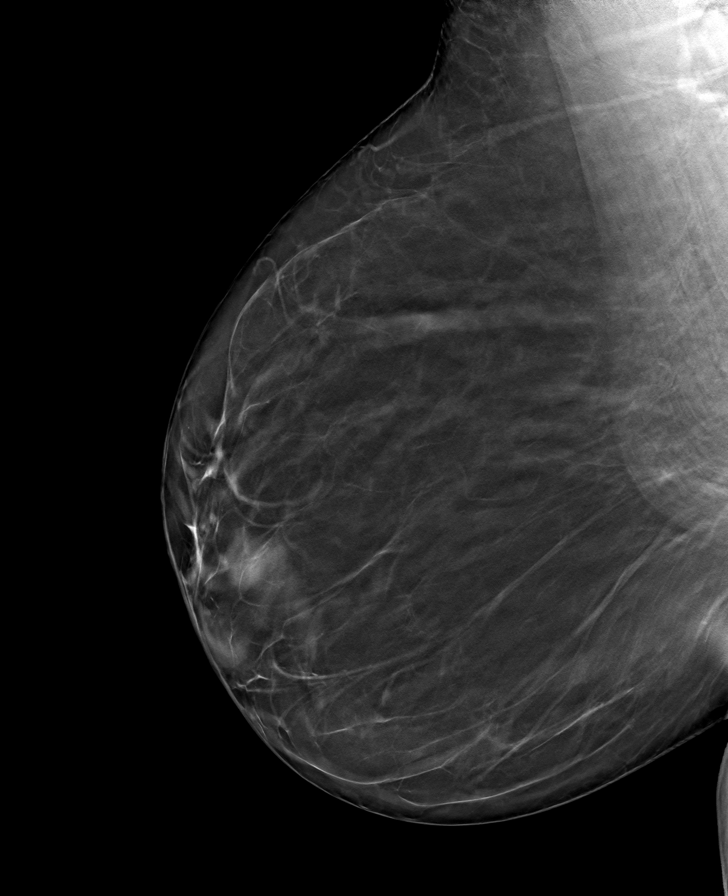

[L CC tomo · tomo slice 38/75.0]
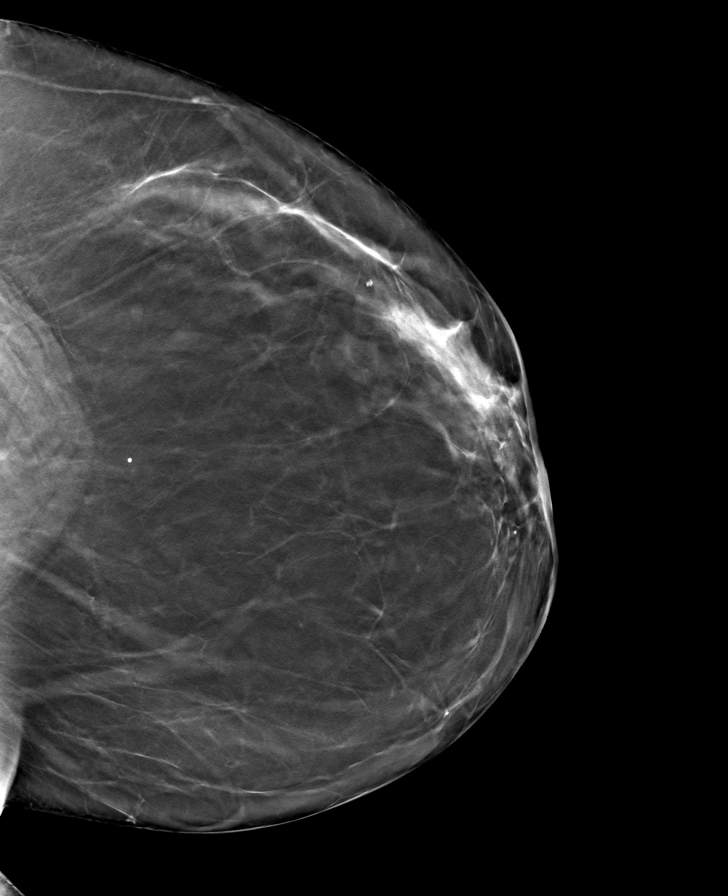

[L MLO tomo · tomo slice 42/83.0]
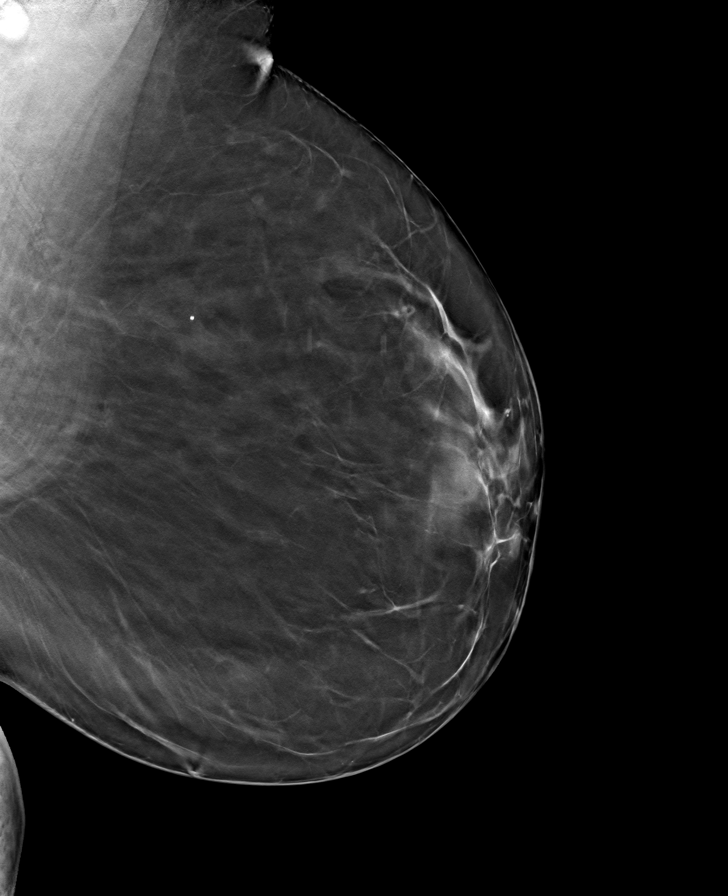

[8 of 24 positions shown; findings below may reference images not displayed]

ACR Breast Density Category b: There are scattered areas of
fibroglandular density.
FINDINGS: There are no findings suspicious for malignancy.
IMPRESSION: No mammographic evidence of malignancy. A result letter of this
screening mammogram will be mailed directly to the patient.

RECOMMENDATION:
Screening mammogram in one year. (Code:51-O-LD2)

BI-RADS CATEGORY  1: Negative.

## 2022-07-02 ENCOUNTER — Ambulatory Visit (AMBULATORY_SURGERY_CENTER): Payer: BC Managed Care – PPO

## 2022-07-02 VITALS — Ht 61.0 in | Wt 202.0 lb

## 2022-07-02 DIAGNOSIS — Z1211 Encounter for screening for malignant neoplasm of colon: Secondary | ICD-10-CM

## 2022-07-02 MED ORDER — NA SULFATE-K SULFATE-MG SULF 17.5-3.13-1.6 GM/177ML PO SOLN: 1.0000 | Freq: Once | ORAL | 0 refills | Status: AC

## 2022-07-02 NOTE — Progress Notes (Signed)
No egg or soy allergy known to patient   No issues known to pt with past sedation with any surgeries or procedures  Patient denies ever being told they had issues or difficulty with intubation   No FH of Malignant Hyperthermia  Pt is not on diet pills  Pt is not on  home 02   Pt is not on blood thinners   Pt denies issues with constipation   No A fib or A flutter  Have any cardiac testing pending--no Pt instructed to use [email or web address] or GoodRx for a price reduction on prep   

## 2022-07-03 ENCOUNTER — Encounter: Payer: Self-pay | Admitting: Gastroenterology

## 2022-07-20 ENCOUNTER — Telehealth: Payer: Self-pay | Admitting: Gastroenterology

## 2022-07-20 NOTE — Telephone Encounter (Signed)
Called the patient back and confirmed instructions for her colonoscopy on Melanie Hopkins 07/23/22.

## 2022-07-20 NOTE — Telephone Encounter (Signed)
Inbound call from patient, states she received her instructions for her procedure scheduled for 3/25. Patient states her instructions had the incorrect days and got confused. She ate nuts on Wednesday and drank Metamucil on Wednesday and Thursday, would like to speak to a nurse in regards.

## 2022-07-23 ENCOUNTER — Telehealth: Payer: Self-pay | Admitting: Gastroenterology

## 2022-07-23 ENCOUNTER — Encounter: Payer: BC Managed Care – PPO | Admitting: Gastroenterology

## 2022-07-23 NOTE — Telephone Encounter (Signed)
Good Morning Dr. Silverio Decamp,  I called this patient today and left a message thjat if she was running late or had to reschedule to please call us back.  I will NO SHOW her  [employer]

## 2022-07-23 NOTE — Telephone Encounter (Signed)
ok 

## 2023-08-05 ENCOUNTER — Other Ambulatory Visit: Payer: Self-pay | Admitting: Family Medicine

## 2023-08-05 DIAGNOSIS — Z1231 Encounter for screening mammogram for malignant neoplasm of breast: Secondary | ICD-10-CM

## 2023-08-13 ENCOUNTER — Ambulatory Visit
Admission: RE | Admit: 2023-08-13 | Discharge: 2023-08-13 | Disposition: A | Discharge status: Home / Self Care | Source: Ambulatory Visit | Attending: Family Medicine | Admitting: Family Medicine

## 2023-08-13 DIAGNOSIS — Z1231 Encounter for screening mammogram for malignant neoplasm of breast: Secondary | ICD-10-CM

## 2023-08-16 ENCOUNTER — Other Ambulatory Visit: Payer: Self-pay | Admitting: Family Medicine

## 2023-08-16 DIAGNOSIS — R928 Other abnormal and inconclusive findings on diagnostic imaging of breast: Secondary | ICD-10-CM

## 2023-08-28 ENCOUNTER — Ambulatory Visit
Admission: RE | Admit: 2023-08-28 | Discharge: 2023-08-28 | Disposition: A | Discharge status: Home / Self Care | Source: Ambulatory Visit | Attending: Family Medicine | Admitting: Family Medicine

## 2023-08-28 DIAGNOSIS — R928 Other abnormal and inconclusive findings on diagnostic imaging of breast: Secondary | ICD-10-CM

## 2023-12-25 ENCOUNTER — Encounter: Payer: Self-pay | Admitting: Obstetrics and Gynecology

## 2023-12-25 ENCOUNTER — Ambulatory Visit (INDEPENDENT_AMBULATORY_CARE_PROVIDER_SITE_OTHER): Admitting: Obstetrics and Gynecology

## 2023-12-25 VITALS — BP 124/82 | HR 100 | Ht 64.0 in | Wt 209.0 lb

## 2023-12-25 DIAGNOSIS — Z01419 Encounter for gynecological examination (general) (routine) without abnormal findings: Secondary | ICD-10-CM | POA: Diagnosis not present

## 2023-12-25 DIAGNOSIS — N644 Mastodynia: Secondary | ICD-10-CM

## 2023-12-25 DIAGNOSIS — Z1331 Encounter for screening for depression: Secondary | ICD-10-CM

## 2023-12-25 MED ORDER — NYSTATIN 100000 UNIT/GM EX POWD: 1.0000 | Freq: Three times a day (TID) | CUTANEOUS | 2 refills | Status: AC

## 2023-12-25 NOTE — Patient Instructions (Signed)

## 2023-12-25 NOTE — Progress Notes (Signed)
 53 y.o. [id number] Married [place] American female here for annual exam. Has a rash under her breast, has been there almost 2 weeks.     Having left inferior breast discomfort since she had diagnostic left breast imaging, which was benign.  Does not have the pain consistently.   Night sweats decreased significantly.  Estradiol  70 and FSH 52 on 04/04/22.  No bladder control issues.   Decreased libido.  Difficulty losing weight.    Working part time.  PCP: Starla Stapler, FNP   Patient's last menstrual period was 02/26/2018 (exact date).           Sexually active: Yes.    The current method of family planning is vasectomy and status post hysterectomy.    Menopausal hormone therapy:  n/a Exercising: Yes.    Walking and bike riding  Smoker:  no  OB History  Gravida Para Term Preterm AB Living  3 2 2  0 1 2  SAB IAB Ectopic Multiple Live Births  0 0 0 0 2    # Outcome Date GA Lbr Len/2nd Weight Sex Type Anes PTL Lv  3 AB           2 Term     F Vag-Spont   LIV  1 Term     F CS-Unspec   LIV     HEALTH MAINTENANCE: Last 2 paps:  01/13/18 neg HPV neg History of abnormal Pap or positive HPV:  no Mammogram:   08/28/23 L Breast Density Cat B, BIRADS Cat 2 benign  Colonoscopy:  August - 2025 - polyp - she has follow up with GI in September.  Bone Density:  n/a  Result  n/a   Immunization History  Administered Date(s) Administered   Moderna Sars-Covid-2 Vaccination 07/14/2019, 08/11/2019, 05/20/2020      reports that she has never smoked. She has never used smokeless tobacco. She reports that she does not drink alcohol and does not use drugs.  Past Medical History:  Diagnosis Date   Abnormal uterine bleeding    Anxiety    no meds   Depression    no meds   Elevated liver function tests 2020   Elevated WBC count 2020   Fibroid    Hyperlipidemia    no med, diet controlled   Hypertension    Superficial thrombophlebitis 2005   One episode.  Treated with ASA.   SVD  (spontaneous vaginal delivery) 10/1991   x 1    Past Surgical History:  Procedure Laterality Date   CESAREAN SECTION  04/1988   x 1   CYSTOSCOPY N/A 03/18/2018   Procedure: CYSTOSCOPY;  Surgeon: Cathlyn JAYSON Nikki Bobie FORBES, MD;  Location: [facility];  Service: Gynecology;  Laterality: N/A;   HERNIA REPAIR     supraumbilical herniorrhaphy after birth of second child.  [facility] in [place].   No mesh per patient.   LAPAROSCOPIC LYSIS OF ADHESIONS  03/18/2018   Procedure: LAPAROSCOPIC LYSIS OF ADHESIONS;  Surgeon: Cathlyn JAYSON Nikki Bobie FORBES, MD;  Location: [facility];  Service: Gynecology;;   OOPHORECTOMY Right    PERINEAL LACERATION REPAIR  03/18/2018   Procedure: REPAIR PERINEAL LACERATION;  Surgeon: Cathlyn JAYSON Nikki Bobie FORBES, MD;  Location: [facility];  Service: Gynecology;;   TOTAL LAPAROSCOPIC HYSTERECTOMY WITH SALPINGECTOMY  03/18/2018   Procedure: TOTAL LAPAROSCOPIC HYSTERECTOMY WITH BILATERAL SALPINGECTOMY WITH VAGINAL MORCELLATION;  Surgeon: Cathlyn JAYSON Nikki Bobie FORBES, MD;  Location: [facility];  Service: Gynecology;;  please have all sizes of  alexis morcellation bag in the room un-opened   WISDOM TOOTH EXTRACTION      Current Outpatient Medications  Medication Sig Dispense Refill   atorvastatin (LIPITOR) 20 MG tablet Take 20 mg by mouth daily.     losartan-hydrochlorothiazide  (HYZAAR) 50-12.5 MG tablet Take 1 tablet by mouth daily.     Vitamin D, Ergocalciferol, (DRISDOL) 1.25 MG (50000 UNIT) CAPS capsule Take 50,000 Units by mouth once a week.     No current facility-administered medications for this visit.    ALLERGIES: Patient has no known allergies.  Family History  Problem Relation Age of Onset   Colon polyps Mother    Hypertension Mother    Hyperlipidemia Mother    Hypertension Father    Hyperlipidemia Father    Congestive Heart Failure Father    Esophageal cancer Paternal Uncle    Colon cancer Neg Hx    Rectal cancer Neg Hx    Stomach cancer Neg Hx     Review of Systems   All other systems reviewed and are negative.   PHYSICAL EXAM:  BP 124/82 (BP Location: Left Arm, Patient Position: Sitting)   Pulse 100   Ht 5' 4 (1.626 m)   Wt 209 lb (94.8 kg)   LMP 02/26/2018 (Exact Date) Comment: Depo Injection 01/2018  SpO2 100%   BMI 35.87 kg/m     General appearance: alert, cooperative and appears stated age Head: normocephalic, without obvious abnormality, atraumatic Neck: no adenopathy, supple, symmetrical, trachea midline and thyroid normal to inspection and palpation Lungs: clear to auscultation bilaterally Breasts: normal appearance, no masses or tenderness, No nipple retraction or dimpling, No nipple discharge or bleeding, No axillary adenopathy Heart: regular rate and rhythm Abdomen: soft, non-tender; no masses, no organomegaly Extremities: extremities normal, atraumatic, no cyanosis or edema Skin: skin color, texture, turgor normal. No rashes or lesions Lymph nodes: cervical, supraclavicular, and axillary nodes normal. Neurologic: grossly normal  Pelvic: External genitalia:  no lesions              No abnormal inguinal nodes palpated.              Urethra:  normal appearing urethra with no masses, tenderness or lesions              Bartholins and Skenes: normal                 Vagina: normal appearing vagina with normal color and discharge, no lesions              Cervix: absent              Pap taken: no Bimanual Exam:  Uterus:  absent              Adnexa: no mass, fullness, tenderness              Rectal exam: yes.  Confirms.              Anus:  normal sphincter tone, no lesions  Chaperone was present for exam:  Kari HERO, CMA  ASSESSMENT: Well woman visit with gynecologic exam. Status post total laparoscopic hysterectomy with bilateral salpingectomy, lysis of adhesions, vaginal morcellation of the uterus and cervix in an Alexis bag, repair of perineal laceration, cystoscopy 2019.  Status post prior right oophorectomy.  Has left ovary  remaining.  Menopausal symptoms.  Improved.  Decreased libido.  Left breast pain.  No mass.  Weight gain.  Hx superficial thrombophlebitis.  PHQ-2-9: 0  PLAN: Mammogram  screening discussed. Self breast awareness reviewed. Pap and HRV collected:  no.  Not indicated.  Guidelines for Calcium, Vitamin D, regular exercise program including cardiovascular and weight bearing exercise. Medication:  Nystatin  powder for fungal infection of flexural skin.  We discussed tx for decreased libido:  Testosterone, Wellbutrin.  No Rx given.  We discussed breast pain.   Reduce use of caffeine.   She will change her bra to see if this helps her breast pain.  If not, will consider re-imaging of breast.  I suggested weight loss through a weight management clinic. Follow up:  yearly and prn.

## 2024-12-29 ENCOUNTER — Ambulatory Visit: Admitting: Obstetrics and Gynecology
